# Patient Record
Sex: Female | Born: 1945 | Race: White | Hispanic: No | Marital: Married | Smoking: Never smoker
Health system: Southern US, Community
[De-identification: ages and names within clinical notes are randomized; demographics above are authoritative.]

## PROBLEM LIST (undated history)

## (undated) DIAGNOSIS — K7460 Unspecified cirrhosis of liver: Secondary | ICD-10-CM

## (undated) DIAGNOSIS — D696 Thrombocytopenia, unspecified: Secondary | ICD-10-CM

## (undated) DIAGNOSIS — D649 Anemia, unspecified: Secondary | ICD-10-CM

## (undated) DIAGNOSIS — M8580 Other specified disorders of bone density and structure, unspecified site: Secondary | ICD-10-CM

## (undated) DIAGNOSIS — E119 Type 2 diabetes mellitus without complications: Secondary | ICD-10-CM

## (undated) DIAGNOSIS — C801 Malignant (primary) neoplasm, unspecified: Secondary | ICD-10-CM

## (undated) DIAGNOSIS — K219 Gastro-esophageal reflux disease without esophagitis: Secondary | ICD-10-CM

## (undated) DIAGNOSIS — N3010 Interstitial cystitis (chronic) without hematuria: Secondary | ICD-10-CM

## (undated) DIAGNOSIS — I10 Essential (primary) hypertension: Secondary | ICD-10-CM

## (undated) DIAGNOSIS — E785 Hyperlipidemia, unspecified: Secondary | ICD-10-CM

## (undated) HISTORY — PX: COLONOSCOPY: SHX174

## (undated) HISTORY — DX: Essential (primary) hypertension: I10

## (undated) HISTORY — PX: BRAIN MENINGIOMA EXCISION: SHX576

## (undated) HISTORY — DX: Gastro-esophageal reflux disease without esophagitis: K21.9

## (undated) HISTORY — DX: Other specified disorders of bone density and structure, unspecified site: M85.80

## (undated) HISTORY — DX: Malignant (primary) neoplasm, unspecified: C80.1

## (undated) HISTORY — DX: Type 2 diabetes mellitus without complications: E11.9

## (undated) HISTORY — PX: POLYPECTOMY: SHX149

## (undated) HISTORY — DX: Interstitial cystitis (chronic) without hematuria: N30.10

## (undated) HISTORY — DX: Hyperlipidemia, unspecified: E78.5

## (undated) HISTORY — DX: Unspecified cirrhosis of liver: K74.60

## (undated) HISTORY — DX: Thrombocytopenia, unspecified: D69.6

## (undated) HISTORY — PX: PARTIAL HYSTERECTOMY: SHX80

## (undated) HISTORY — DX: Anemia, unspecified: D64.9

## (undated) HISTORY — PX: LAMINECTOMY: SHX219

---

## 2012-04-27 DIAGNOSIS — IMO0002 Reserved for concepts with insufficient information to code with codable children: Secondary | ICD-10-CM | POA: Insufficient documentation

## 2012-04-27 DIAGNOSIS — R3915 Urgency of urination: Secondary | ICD-10-CM | POA: Insufficient documentation

## 2012-04-27 DIAGNOSIS — R350 Frequency of micturition: Secondary | ICD-10-CM | POA: Insufficient documentation

## 2012-04-27 DIAGNOSIS — R109 Unspecified abdominal pain: Secondary | ICD-10-CM | POA: Insufficient documentation

## 2013-05-24 DIAGNOSIS — R351 Nocturia: Secondary | ICD-10-CM | POA: Insufficient documentation

## 2014-03-05 ENCOUNTER — Encounter: Payer: Self-pay | Admitting: Oncology

## 2014-03-05 ENCOUNTER — Ambulatory Visit (INDEPENDENT_AMBULATORY_CARE_PROVIDER_SITE_OTHER): Payer: Medicare Other | Admitting: Oncology

## 2014-03-05 VITALS — BP 149/56 | HR 85 | Temp 98.2°F | Ht 66.0 in | Wt 181.0 lb

## 2014-03-05 DIAGNOSIS — N3010 Interstitial cystitis (chronic) without hematuria: Secondary | ICD-10-CM

## 2014-03-05 DIAGNOSIS — D696 Thrombocytopenia, unspecified: Secondary | ICD-10-CM | POA: Insufficient documentation

## 2014-03-05 DIAGNOSIS — D61818 Other pancytopenia: Secondary | ICD-10-CM

## 2014-03-05 HISTORY — DX: Interstitial cystitis (chronic) without hematuria: N30.10

## 2014-03-05 HISTORY — DX: Thrombocytopenia, unspecified: D69.6

## 2014-03-05 LAB — CBC WITH DIFFERENTIAL/PLATELET
BASOS PCT: 0 % (ref 0–1)
Basophils Absolute: 0 10*3/uL (ref 0.0–0.1)
Eosinophils Absolute: 0.1 10*3/uL (ref 0.0–0.7)
Eosinophils Relative: 2 % (ref 0–5)
HCT: 35.8 % — ABNORMAL LOW (ref 36.0–46.0)
Hemoglobin: 11.3 g/dL — ABNORMAL LOW (ref 12.0–15.0)
LYMPHS ABS: 0.7 10*3/uL (ref 0.7–4.0)
Lymphocytes Relative: 23 % (ref 12–46)
MCH: 27.7 pg (ref 26.0–34.0)
MCHC: 31.6 g/dL (ref 30.0–36.0)
MCV: 87.7 fL (ref 78.0–100.0)
MONO ABS: 0.3 10*3/uL (ref 0.1–1.0)
Monocytes Relative: 9 % (ref 3–12)
NEUTROS PCT: 66 % (ref 43–77)
Neutro Abs: 2.1 10*3/uL (ref 1.7–7.7)
PLATELETS: 65 10*3/uL — AB (ref 150–400)
RBC: 4.08 MIL/uL (ref 3.87–5.11)
RDW: 14.1 % (ref 11.5–15.5)
WBC: 3.2 10*3/uL — ABNORMAL LOW (ref 4.0–10.5)

## 2014-03-05 LAB — COMPREHENSIVE METABOLIC PANEL
ALT: 24 U/L (ref 0–35)
ANION GAP: 5 (ref 5–15)
AST: 32 U/L (ref 0–37)
Albumin: 4.4 g/dL (ref 3.5–5.2)
Alkaline Phosphatase: 85 U/L (ref 39–117)
BILIRUBIN TOTAL: 0.8 mg/dL (ref 0.3–1.2)
BUN: 13 mg/dL (ref 6–23)
CALCIUM: 9.6 mg/dL (ref 8.4–10.5)
CO2: 29 mmol/L (ref 19–32)
Chloride: 105 mmol/L (ref 96–112)
Creatinine, Ser: 0.78 mg/dL (ref 0.50–1.10)
GFR calc Af Amer: >90 mL/min (ref >90)
GFR calc non Af Amer: 84 mL/min — ABNORMAL LOW (ref >90)
GLUCOSE: 124 mg/dL — AB (ref 70–99)
Potassium: 4 mmol/L (ref 3.5–5.1)
SODIUM: 139 mmol/L (ref 135–145)
Total Protein: 6.9 g/dL (ref 6.0–8.3)

## 2014-03-05 LAB — URINE MICROSCOPIC-ADD ON

## 2014-03-05 LAB — URINALYSIS, ROUTINE W REFLEX MICROSCOPIC
BILIRUBIN URINE: NEGATIVE
Glucose, UA: NEGATIVE mg/dL
Ketones, ur: NEGATIVE mg/dL
Nitrite: NEGATIVE
Protein, ur: NEGATIVE mg/dL
SPECIFIC GRAVITY, URINE: 1.011 (ref 1.005–1.030)
UROBILINOGEN UA: 0.2 mg/dL (ref 0.0–1.0)
pH: 5.5 (ref 5.0–8.0)

## 2014-03-05 LAB — SAVE SMEAR

## 2014-03-05 LAB — RETICULOCYTES
RBC.: 4.08 MIL/uL (ref 3.87–5.11)
RETIC COUNT ABSOLUTE: 24.5 10*3/uL (ref 19.0–186.0)
Retic Ct Pct: 0.6 % (ref 0.4–3.1)

## 2014-03-05 LAB — LACTATE DEHYDROGENASE: LDH: 151 U/L (ref 94–250)

## 2014-03-05 NOTE — Patient Instructions (Signed)
Schedule bone marrow aspiration and biopsy with Radiology: order entered in EPIC Return visit with Dr Beryle Beams 2-3 weeks after bone marrow done to review results

## 2014-03-05 NOTE — Progress Notes (Signed)
Patient ID: Yvonne Carrillo, female   DOB: 12-Aug-1945, 69 y.o.   MRN: [id number] New Patient Hematology   Yvonne Carrillo [id number] [birth year] 69 y.o. 03/05/2014  CC: Dr. Alona Bene; Dr. Woody Seller, [facility] [address] [place], [place], [address]   Reason for referral: Chronic thrombocytopenia with recent fall from baseline count   HPI:  69 year old woman with hypertension, type 2 diabetes on oral agent, hyperlipidemia, and chronic interstitial cystitis. She was first noted to have a mild decrease in her platelet count as far back as 1996 when platelet count was recorded as 110,000. Except for a platelet count of 79,000 recorded in 2000, and platelet counts have been consistently above 100,000 until her most recent counts done by her primary care physician on 11/01/2013 when platelet count was 60,000.  She keeps her a detailed records of her laboratory results over the years. It is now apparent that she does not have isolated thrombocytopenia but in fact, mild pancytopenia. 12/11/2005: Hemoglobin 12.5, hematocrit 37.2, white count 6500, platelets 129,000 10/18/2007: Hemoglobin 12.7, hematocrit 43.2, white count 4500, platelets 102,000 01/14/2011: Hemoglobin 12.8, hematocrit 39.4, white count 4000, platelets 82,000 01/04/2012: Hemoglobin 12.5, hematocrit 37.5, white count 6200, platelets 94,000 07/15/2012: Hemoglobin 12.5, matter crit 39.2, white count 4300, platelets 95,000 04/11/2013: Hemoglobin 12, hematocrit 37.2, white count 3300, platelets 80,000 11/01/2013: Hemoglobin 12.7, hematocrit 38.8, white count 3900, platelets 60,000 03/05/2014: Hemoglobin 11.3, hematocrit 35.8, white count 3200, platelets 65,000.                      Differential: 66% neutrophils, when he 3 lymphocytes, 9 monocytes, 2 eosinophils. MCV 87.7  She has no history of hepatitis, yellow jaundice, or mononucleosis. She denies any signs or symptoms of a collagen vascular disorder and specifically  has never been told she has lupus or rheumatoid arthritis. She denies any easy bruising, epistaxis, hematochezia or melena. Occasional microscopic hematuria related to chronic interstitial cystitis. She has been on Elmiron for about 2 years but low platelet count antedated starting this medication. 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 She does not use alcohol. She is a never smoker. There is no family history of any blood disorder and her parents, sister, or 3 daughters.    PMH: Past Medical History  Diagnosis Date  . Thrombocytopenia 03/05/2014  . Chronic interstitial cystitis 03/05/2014  No history of MI, ulcers, tuberculosis, asthma, emphysema, thyroid disease, inflammatory arthritis. See also, history of present illness  Previous surgery: 1993 hysterectomy without oophorectomy for retroverted uterus 1991 resection of a meningioma Laminectomy 1975, repeat 2007 Colonoscopy 2013. Single benign polyp removed. Annual mammograms reported to her as normal.  Allergies: Allergies  Allergen Reactions  . Epinephrine Other (See Comments)    Increase heart rate  . Penicillins Rash    Medications:  Current outpatient prescriptions:  .  levocetirizine (XYZAL) 5 MG tablet, Take 5 mg by mouth every evening., Disp: , Rfl:  .  pentosan polysulfate (ELMIRON) 100 MG capsule, Take 200 mg by mouth daily., Disp: , Rfl:  .  simvastatin (ZOCOR) 40 MG tablet, Take 20 mg by mouth at bedtime., Disp: , Rfl:  .  sitaGLIPtin-metformin (JANUMET) 50-1000 MG per tablet, Take 1 tablet by mouth daily with supper., Disp: , Rfl:  .  traZODone (DESYREL) 50 MG tablet, Take 25 mg by mouth at bedtime., Disp: , Rfl:  .  valsartan (DIOVAN) 160 MG tablet, Take 160 mg by mouth daily., Disp: , Rfl:   Social History: Married and husband accompanies her today. They have 3 daughters aged 43, 51, and 67 are all  healthy. 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  she has never smoked.   she does not drink alcohol   Family History: Mother was a smoker, died of complications of obstructive airway disease at age 62. Father died of a stroke at age 30. One sister died of lung cancer at age 72. She was a smoker.  Review of Systems: See HPI Remaining ROS negative.  Physical Exam: Blood pressure 149/56, pulse 85, temperature 98.2 F (36.8 C), temperature source Oral, height 5\' 6"  (1.676 m), weight 181 lb (82.101 kg), SpO2 100 %. Wt Readings from Last 3 Encounters:  03/05/14 181 lb (82.101 kg)     General appearance: well nourished caucasian woman HENNT: Pharynx no erythema, exudate, mass, or ulcer. No thyromegaly or thyroid nodules Lymph nodes: No cervical, supraclavicular, or axillary lymphadenopathy Breasts:  Lungs: Clear to auscultation, resonant to percussion throughout Heart: Regular rhythm, no murmur, no gallop, no rub, no click, no edema Abdomen: Soft, nontender, normal bowel sounds, no mass, no organomegaly Extremities: No edema, no calf tenderness Musculoskeletal: no joint deformities GU Vascular: Carotid pulses 2+, no bruits,  Neurologic: Alert, oriented, PERRLA, optic discs sharp and vessels normal, no hemorrhage or exudate, cranial nerves grossly normal, motor strength 5 over 5, reflexes 1+ symmetric, upper body coordination normal, gait normal, Skin: No rash or ecchymosis    Lab Results: Lab Results  Component Value Date   WBC 3.2* 03/05/2014   HGB 11.3* 03/05/2014   HCT 35.8* 03/05/2014   MCV 87.7 03/05/2014   PLT 65* 03/05/2014     Chemistry      Component Value Date/Time   NA 139 03/05/2014 1403   K 4.0 03/05/2014 1403   CL 105 03/05/2014 1403   CO2 29 03/05/2014 1403   BUN 13 03/05/2014 1403   CREATININE 0.78 03/05/2014 1403      Component Value Date/Time   CALCIUM 9.6 03/05/2014 1403   ALKPHOS 85 03/05/2014 1403    AST 32 03/05/2014 1403   ALT 24 03/05/2014 1403   BILITOT 0.8 03/05/2014 1403       Review of peripheral blood film:     Impression: Mild pancytopenia for the last 7 years now with trend for progression.  Recommendation: We need a bone marrow aspiration and biopsy to establish the etiology of her pancytopenia. We discussed this in detail today. She would prefer to have the procedure done under sedation therefore I am going to refer her to our interventional radiology team. The most important information that we can obtain from the marrow will be the percentage of blasts and whether or not there are any chromosomal abnormalities which will help to stratify her risk if this is a myelodysplastic syndrome. We will also be looking to see if there are any excess plasma cells or any lymphoid aggregates. I will meet with her and her husband 2-3 weeks after the procedure to review results and any treatment recommendations .      Annia Belt, MD 03/05/2014, 5:18 PM

## 2014-03-07 ENCOUNTER — Telehealth: Payer: Self-pay | Admitting: *Deleted

## 2014-03-07 LAB — URINE CULTURE
COLONY COUNT: NO GROWTH
CULTURE: NO GROWTH

## 2014-03-07 NOTE — Telephone Encounter (Signed)
Called pt -not at home. Talked to pt's husband  - informed him to let her know  "urine culture no growth" per Dr Beryle Beams. Stated he would inform his wife and she can call if has any questions.

## 2014-03-07 NOTE — Telephone Encounter (Signed)
-----   Message from Annia Belt, MD sent at 03/07/2014 10:59 AM EST ----- Call pt urine culture no growth

## 2014-03-09 ENCOUNTER — Other Ambulatory Visit: Payer: Self-pay | Admitting: Radiology

## 2014-03-12 ENCOUNTER — Other Ambulatory Visit: Payer: Self-pay | Admitting: Radiology

## 2014-03-13 ENCOUNTER — Ambulatory Visit (HOSPITAL_COMMUNITY)
Admission: RE | Admit: 2014-03-13 | Discharge: 2014-03-13 | Disposition: A | Discharge status: Home / Self Care | Payer: Medicare Other | Source: Ambulatory Visit | Attending: Oncology | Admitting: Oncology

## 2014-03-13 ENCOUNTER — Encounter (HOSPITAL_COMMUNITY): Payer: Self-pay

## 2014-03-13 DIAGNOSIS — Z79899 Other long term (current) drug therapy: Secondary | ICD-10-CM | POA: Insufficient documentation

## 2014-03-13 DIAGNOSIS — D61818 Other pancytopenia: Secondary | ICD-10-CM | POA: Insufficient documentation

## 2014-03-13 DIAGNOSIS — N3010 Interstitial cystitis (chronic) without hematuria: Secondary | ICD-10-CM | POA: Insufficient documentation

## 2014-03-13 DIAGNOSIS — D696 Thrombocytopenia, unspecified: Secondary | ICD-10-CM | POA: Insufficient documentation

## 2014-03-13 LAB — CBC WITH DIFFERENTIAL/PLATELET
BASOS PCT: 0 % (ref 0–1)
Basophils Absolute: 0 10*3/uL (ref 0.0–0.1)
Eosinophils Absolute: 0.1 10*3/uL (ref 0.0–0.7)
Eosinophils Relative: 2 % (ref 0–5)
HCT: 37.1 % (ref 36.0–46.0)
HEMOGLOBIN: 11.6 g/dL — AB (ref 12.0–15.0)
LYMPHS ABS: 0.8 10*3/uL (ref 0.7–4.0)
Lymphocytes Relative: 23 % (ref 12–46)
MCH: 27.8 pg (ref 26.0–34.0)
MCHC: 31.3 g/dL (ref 30.0–36.0)
MCV: 88.8 fL (ref 78.0–100.0)
Monocytes Absolute: 0.5 10*3/uL (ref 0.1–1.0)
Monocytes Relative: 13 % — ABNORMAL HIGH (ref 3–12)
NEUTROS PCT: 63 % (ref 43–77)
Neutro Abs: 2.3 10*3/uL (ref 1.7–7.7)
Platelets: 58 10*3/uL — ABNORMAL LOW (ref 150–400)
RBC: 4.18 MIL/uL (ref 3.87–5.11)
RDW: 14.1 % (ref 11.5–15.5)
WBC: 3.7 10*3/uL — AB (ref 4.0–10.5)

## 2014-03-13 LAB — APTT: aPTT: 40 seconds — ABNORMAL HIGH (ref 24–37)

## 2014-03-13 LAB — PROTIME-INR
INR: 1.13 (ref 0.00–1.49)
PROTHROMBIN TIME: 14.6 s (ref 11.6–15.2)

## 2014-03-13 LAB — GLUCOSE, CAPILLARY: GLUCOSE-CAPILLARY: 121 mg/dL — AB (ref 70–99)

## 2014-03-13 LAB — BONE MARROW EXAM

## 2014-03-13 MED ORDER — HYDROCODONE-ACETAMINOPHEN 5-325 MG PO TABS
1.0000 | ORAL_TABLET | ORAL | Status: DC | PRN
  Filled 2014-03-13: qty 2

## 2014-03-13 MED ORDER — FENTANYL CITRATE 0.05 MG/ML IJ SOLN
INTRAMUSCULAR | Status: AC
  Filled 2014-03-13: qty 4

## 2014-03-13 MED ORDER — MIDAZOLAM HCL 2 MG/2ML IJ SOLN
INTRAMUSCULAR | Status: AC | PRN
  Administered 2014-03-13: 0.5 mg via INTRAVENOUS
  Administered 2014-03-13: 1 mg via INTRAVENOUS

## 2014-03-13 MED ORDER — FENTANYL CITRATE 0.05 MG/ML IJ SOLN
INTRAMUSCULAR | Status: AC | PRN
  Administered 2014-03-13: 50 ug via INTRAVENOUS
  Administered 2014-03-13: 25 ug via INTRAVENOUS

## 2014-03-13 MED ORDER — MIDAZOLAM HCL 2 MG/2ML IJ SOLN
INTRAMUSCULAR | Status: AC
  Filled 2014-03-13: qty 6

## 2014-03-13 MED ORDER — SODIUM CHLORIDE 0.9 % IV SOLN
INTRAVENOUS | Status: DC
  Administered 2014-03-13: 07:00:00 via INTRAVENOUS

## 2014-03-13 NOTE — H&P (Addendum)
Chief Complaint: "I am her for a bone marrow biopsy."  Referring Physician(s): Granfortuna,James M  History of Present Illness: Yvonne Carrillo is a 69 y.o. female with pancytopenia who has been seen by Dr. Beryle Beams and scheduled today for an image guided bone marrow biopsy. She denies any chest pain, shortness of breath or palpitations. She denies any active signs of bleeding or excessive bruising. She denies any recent fever or chills. The patient denies any history of sleep apnea or chronic oxygen use. She has previously tolerated sedation without complications during a colonoscopy.     Past Medical History  Diagnosis Date  . Thrombocytopenia 03/05/2014  . Chronic interstitial cystitis 03/05/2014    History reviewed. No pertinent past surgical history.  Allergies: Epinephrine and Penicillins  Medications: Prior to Admission medications   Medication Sig Start Date End Date Taking? Authorizing Provider  levocetirizine (XYZAL) 5 MG tablet Take 5 mg by mouth every evening.   Yes Historical Provider, MD  pentosan polysulfate (ELMIRON) 100 MG capsule Take 100-200 mg by mouth 2 (two) times daily.    Yes Historical Provider, MD  simvastatin (ZOCOR) 40 MG tablet Take 20 mg by mouth at bedtime.   Yes Historical Provider, MD  sitaGLIPtin-metformin (JANUMET) 50-1000 MG per tablet Take 1 tablet by mouth daily with supper.   Yes Historical Provider, MD  traZODone (DESYREL) 50 MG tablet Take 25 mg by mouth at bedtime.   Yes Historical Provider, MD  valsartan (DIOVAN) 160 MG tablet Take 160 mg by mouth every morning.    Yes Historical Provider, MD     History reviewed. No pertinent family history.  History   Social History  . Marital Status: Married    Spouse Name: N/A  . Number of Children: N/A  . Years of Education: N/A   Social History Main Topics  . Smoking status: Never Smoker   . Smokeless tobacco: Not on file  . Alcohol Use: No  . Drug Use: No  . Sexual Activity: Not on  file   Other Topics Concern  . None   Social History Narrative    Review of Systems: A 12 point ROS discussed and pertinent positives are indicated in the HPI above.  All other systems are negative.  Review of Systems  Vital Signs: BP 159/64 mmHg  Pulse 92  Temp(Src) 97.6 F (36.4 C) (Oral)  Ht _0  (1.676 m)  Wt 181 lb (82.101 kg)  BMI 29.23 kg/m2  SpO2 100%  Physical Exam  Constitutional: She is oriented to person, place, and time. No distress.  HENT:  Head: Normocephalic and atraumatic.  Neck: No tracheal deviation present.  Cardiovascular: Normal rate and regular rhythm.  Exam reveals no gallop and no friction rub.   No murmur heard. Pulmonary/Chest: Effort normal and breath sounds normal. No respiratory distress. She has no wheezes. She has no rales.  Abdominal: Soft. Bowel sounds are normal. She exhibits no distension. There is no tenderness.  Neurological: She is alert and oriented to person, place, and time.  Skin: She is not diaphoretic.  Psychiatric: She has a normal mood and affect. Her behavior is normal. Thought content normal.    Mallampati Score:  MD Evaluation Airway: WNL Heart: WNL Abdomen: WNL Chest/ Lungs: WNL ASA  Classification: 2 Mallampati/Airway Score: Two  Imaging: No results found.  Labs:  CBC:  Recent Labs  03/05/14 1403 03/13/14 0720  WBC 3.2* 3.7*  HGB 11.3* 11.6*  HCT 35.8* 37.1  PLT 65* 58*  COAGS:  Recent Labs  03/13/14 0720  INR 1.13  APTT 40*    BMP:  Recent Labs  03/05/14 1403  NA 139  K 4.0  CL 105  CO2 29  GLUCOSE 124*  BUN 13  CALCIUM 9.6  CREATININE 0.78  GFRNONAA 84*  GFRAA >90    LIVER FUNCTION TESTS:  Recent Labs  03/05/14 1403  BILITOT 0.8  AST 32  ALT 24  ALKPHOS 85  PROT 6.9  ALBUMIN 4.4    Assessment and Plan: Pancytopenia History of chronic interstitial cystitis  Seen in the office by Dr. Beryle Beams on 03/05/14 Scheduled today for image guided bone marrow biopsy  with moderate sedation Patient has been NPO, labs reviewed Risks and Benefits discussed with the patient. All of the patient's questions were answered, patient is agreeable to proceed. Consent signed and in chart.   Thank you for this interesting consult.  I greatly enjoyed meeting Yvonne Carrillo and look forward to participating in their care.  SignedHedy Jacob 03/13/2014, 8:34 AM   I spent a total of 15 minutes in face to face in clinical consultation, greater than 50% of which was counseling/coordinating care for pancytopenia.

## 2014-03-13 NOTE — Discharge Instructions (Signed)
Leave dressing on for 24 hours.  You may shower after 24 hours.  Please remove the dressing before you shower.    Conscious Sedation, Adult, Care After Refer to this sheet in the next few weeks. These instructions provide you with information on caring for yourself after your procedure. Your health care provider may also give you more specific instructions. Your treatment has been planned according to current medical practices, but problems sometimes occur. Call your health care provider if you have any problems or questions after your procedure. WHAT TO EXPECT AFTER THE PROCEDURE  After your procedure:  You may feel sleepy, clumsy, and have poor balance for several hours.  Vomiting may occur if you eat too soon after the procedure. HOME CARE INSTRUCTIONS  Do not participate in any activities where you could become injured for at least 24 hours. Do not:  Drive.  Swim.  Ride a bicycle.  Operate heavy machinery.  Cook.  Use power tools.  Climb ladders.  Work from a high place.  Do not make important decisions or sign legal documents until you are improved.  If you vomit, drink water, juice, or soup when you can drink without vomiting. Make sure you have little or no nausea before eating solid foods.  Only take over-the-counter or prescription medicines for pain, discomfort, or fever as directed by your health care provider.  Make sure you and your family fully understand everything about the medicines given to you, including what side effects may occur.  You should not drink alcohol, take sleeping pills, or take medicines that cause drowsiness for at least 24 hours.  If you smoke, do not smoke without supervision.  If you are feeling better, you may resume normal activities 24 hours after you were sedated.  Keep all appointments with your health care provider. SEEK MEDICAL CARE IF:  Your skin is pale or bluish in color.  You continue to feel nauseous or vomit.  Your  pain is getting worse and is not helped by medicine.  You have bleeding or swelling.  You are still sleepy or feeling clumsy after 24 hours. SEEK IMMEDIATE MEDICAL CARE IF:  You develop a rash.  You have difficulty breathing.  You develop any type of allergic problem.  You have a fever. MAKE SURE YOU:  Understand these instructions.  Will watch your condition.  Will get help right away if you are not doing well or get worse. Document Released: 11/02/2012 Document Reviewed: 11/02/2012 [facility] Patient Information 2015 [place], [place]. This information is not intended to replace advice given to you by your health care provider. Make sure you discuss any questions you have with your health care provider. Bone Marrow Aspiration, Bone Marrow Biopsy Care After Read the instructions outlined below and refer to this sheet in the next few weeks. These discharge instructions provide you with general information on caring for yourself after you leave the hospital. Your caregiver may also give you specific instructions. While your treatment has been planned according to the most current medical practices available, unavoidable complications occasionally occur. If you have any problems or questions after discharge, call your caregiver. FINDING OUT THE RESULTS OF YOUR TEST Not all test results are available during your visit. If your test results are not back during the visit, make an appointment with your caregiver to find out the results. Do not assume everything is normal if you have not heard from your caregiver or the medical facility. It is important for you to follow up  on all of your test results.  HOME CARE INSTRUCTIONS  You have had sedation and may be sleepy or dizzy. Your thinking may not be as clear as usual. For the next 24 hours:  Only take over-the-counter or prescription medicines for pain, discomfort, and or fever as directed by your caregiver.  Do not drink alcohol.  Do not  smoke.  Do not drive.  Do not make important legal decisions.  Do not operate heavy machinery.  Do not care for small children by yourself.  Keep your dressing clean and dry. You may replace dressing with a bandage after 24 hours.  You may take a bath or shower after 24 hours.  Use an ice pack for 20 minutes every 2 hours while awake for pain as needed. SEEK MEDICAL CARE IF:   There is redness, swelling, or increasing pain at the biopsy site.  There is pus coming from the biopsy site.  There is drainage from a biopsy site lasting longer than one day.  An unexplained oral temperature above 102 F (38.9 C) develops. SEEK IMMEDIATE MEDICAL CARE IF:   You develop a rash.  You have difficulty breathing.  You develop any reaction or side effects to medications given. Document Released: 08/01/2004 Document Revised: 04/06/2011 Document Reviewed: 01/10/2008 [facility] Patient Information 2015 [place], [place]. This information is not intended to replace advice given to you by your health care provider. Make sure you discuss any questions you have with your health care provider.

## 2014-03-13 NOTE — Procedures (Signed)
CT-guided  R iliac bone marrow aspiration and core biopsy No complication No blood loss. See complete dictation in [facility] PACS  

## 2014-03-21 LAB — TISSUE HYBRIDIZATION (BONE MARROW)-NCBH

## 2014-03-21 LAB — CHROMOSOME ANALYSIS, BONE MARROW

## 2014-04-03 ENCOUNTER — Encounter (HOSPITAL_COMMUNITY): Payer: Self-pay

## 2014-04-09 ENCOUNTER — Encounter: Payer: Self-pay | Admitting: Oncology

## 2014-04-09 ENCOUNTER — Ambulatory Visit (INDEPENDENT_AMBULATORY_CARE_PROVIDER_SITE_OTHER): Payer: Medicare Other | Admitting: Oncology

## 2014-04-09 VITALS — BP 158/77 | HR 84 | Temp 98.1°F | Ht 66.0 in | Wt 179.4 lb

## 2014-04-09 DIAGNOSIS — D61818 Other pancytopenia: Secondary | ICD-10-CM | POA: Diagnosis not present

## 2014-04-09 LAB — CBC WITH DIFFERENTIAL/PLATELET
BASOS PCT: 0 % (ref 0–1)
Basophils Absolute: 0 10*3/uL (ref 0.0–0.1)
EOS ABS: 0 10*3/uL (ref 0.0–0.7)
Eosinophils Relative: 1 % (ref 0–5)
HEMATOCRIT: 35.6 % — AB (ref 36.0–46.0)
Hemoglobin: 11.7 g/dL — ABNORMAL LOW (ref 12.0–15.0)
LYMPHS ABS: 0.9 10*3/uL (ref 0.7–4.0)
Lymphocytes Relative: 21 % (ref 12–46)
MCH: 27.3 pg (ref 26.0–34.0)
MCHC: 32.9 g/dL (ref 30.0–36.0)
MCV: 83 fL (ref 78.0–100.0)
MONOS PCT: 9 % (ref 3–12)
MPV: 9.9 fL (ref 8.6–12.4)
Monocytes Absolute: 0.4 10*3/uL (ref 0.1–1.0)
NEUTROS ABS: 3 10*3/uL (ref 1.7–7.7)
NEUTROS PCT: 69 % (ref 43–77)
Platelets: 63 10*3/uL — ABNORMAL LOW (ref 150–400)
RBC: 4.29 MIL/uL (ref 3.87–5.11)
RDW: 14.3 % (ref 11.5–15.5)
WBC: 4.4 10*3/uL (ref 4.0–10.5)

## 2014-04-09 NOTE — Progress Notes (Signed)
Patient ID: Yvonne Carrillo, female   DOB: 1945-05-11, 69 y.o.   MRN: [id number] The patient returns with her husband today to discuss recent bone marrow biopsy results and for further discussion of her chronic pancytopenia with recent trend for progressive thrombocytopenia.  I have reviewed the bone marrow findings with our pathologist. The bone marrow is normal with no evidence for a myelodysplastic syndrome, leukemia, lymphoma, or multiple myeloma. Normal cytogenetic studies. By process of elimination we have excluded a primary bone marrow disorder as etiology of her pancytopenia. She does not appear to have any signs or symptoms to suggest that this is an immune process although that remains in the differential. I feel we need to concentrate on possible medication toxicity at this time. She was using trazodone to help her sleep. She stopped this now a week ago. In reviewing her other medications, she has been on elmeron to treat chronic interstitial cystitis for over 2 years. Looking this up, although rare, less than 1% of patients, it can cause anemia, leukopenia, and thrombocytopenia. I suggest that she stop the elmeron at this time. If it is a medication effect, it usually takes about 4-6 weeks for the bone marrow to recover. I will see her back in early May to reevaluate her blood counts. I am also going to check for 2 common viruses EBV and CMV which can be associated with chronic bone marrow suppression. She has no history of hepatitis, yellow jaundice, or mononucleosis. She has never required a blood transfusion. Her husband is her only partner. She has never used any intravenous drugs. If antibody titers to either of these viruses are significantly elevated it will only prove that she has been exposed to them and it would not prove that this is the reason why her blood counts are low. We would have to infer this: "Guilt by association".  Recent complete exam not repeated today.  Entire visit spent  with direct face-to-face contact with the patient and her husband approximate time 30 minutes.

## 2014-04-09 NOTE — Patient Instructions (Signed)
Follow up visit with Dr Darnell Level  4/12 3:30 PM CBC same day To lab today  Schedule ultrasound of abdomen 1st available at [facility]

## 2014-04-10 LAB — EBV AB TO VIRAL CAPSID AG PNL, IGG+IGM
EBV VCA IgG: 187 U/mL — ABNORMAL HIGH (ref <18.0)
EBV VCA IgM: 10.9 U/mL (ref <36.0)

## 2014-04-11 LAB — CYTOMEGALOVIRUS ANTIBODY, IGG: CYTOMEGALOVIRUS AB-IGG: 4.4 U/mL — AB (ref <0.60)

## 2014-04-11 LAB — CMV IGM: CMV IgM: <8 AU/mL (ref <30.00)

## 2014-04-26 ENCOUNTER — Other Ambulatory Visit: Payer: Self-pay | Admitting: Oncology

## 2014-04-26 DIAGNOSIS — D61818 Other pancytopenia: Secondary | ICD-10-CM

## 2014-04-26 DIAGNOSIS — R161 Splenomegaly, not elsewhere classified: Secondary | ICD-10-CM

## 2014-05-02 ENCOUNTER — Other Ambulatory Visit (INDEPENDENT_AMBULATORY_CARE_PROVIDER_SITE_OTHER): Payer: Medicare Other

## 2014-05-02 ENCOUNTER — Telehealth: Payer: Self-pay | Admitting: *Deleted

## 2014-05-02 ENCOUNTER — Other Ambulatory Visit: Payer: Self-pay | Admitting: Oncology

## 2014-05-02 ENCOUNTER — Ambulatory Visit
Admission: RE | Admit: 2014-05-02 | Discharge: 2014-05-02 | Disposition: A | Discharge status: Home / Self Care | Payer: Medicare Other | Source: Ambulatory Visit | Attending: Oncology | Admitting: Oncology

## 2014-05-02 DIAGNOSIS — D696 Thrombocytopenia, unspecified: Secondary | ICD-10-CM

## 2014-05-02 DIAGNOSIS — R161 Splenomegaly, not elsewhere classified: Secondary | ICD-10-CM

## 2014-05-02 DIAGNOSIS — D61818 Other pancytopenia: Secondary | ICD-10-CM

## 2014-05-02 LAB — BASIC METABOLIC PANEL
Anion gap: 7 (ref 5–15)
BUN: 10 mg/dL (ref 6–23)
CALCIUM: 9.3 mg/dL (ref 8.4–10.5)
CO2: 30 mmol/L (ref 19–32)
CREATININE: 0.7 mg/dL (ref 0.50–1.10)
Chloride: 103 mmol/L (ref 96–112)
GFR calc non Af Amer: 87 mL/min — ABNORMAL LOW (ref >90)
GLUCOSE: 169 mg/dL — AB (ref 70–99)
POTASSIUM: 4.2 mmol/L (ref 3.5–5.1)
SODIUM: 140 mmol/L (ref 135–145)

## 2014-05-02 NOTE — Telephone Encounter (Signed)
Basic Metabolic Panel ordered prior to CT scan with contrast per protocol to screen for renal dysfunction.  Per patient she can present to clinic this afternoon to have it completed.

## 2014-05-02 NOTE — Addendum Note (Signed)
Addended by: Truddie Crumble on: 05/02/2014 03:26 PM   Modules accepted: Orders

## 2014-05-02 NOTE — Telephone Encounter (Signed)
Returned call to Wells Guiles, [facility] - pt is scheduled for CT scan tomorrow at [facility] and needs a current BUN/CREA. Last one was done 2/8. I called pt who had just left their office about 20 mins ago after picking up the contrast for the procedure but will be able to come this afternoon to our lab. Pt lives in [place], [place]. Need order - since Dr Beryle Beams is on vacation. Thanks

## 2014-05-02 NOTE — Progress Notes (Signed)
A copy of the lab results (BMP) were faxed to [phone],  [facility], [address] 05-02-2014 1632p by Maryan Rued, PBT.

## 2014-05-02 NOTE — Addendum Note (Signed)
Addended by: Truddie Crumble on: 05/02/2014 03:24 PM   Modules accepted: Orders

## 2014-05-03 ENCOUNTER — Ambulatory Visit
Admission: RE | Admit: 2014-05-03 | Discharge: 2014-05-03 | Disposition: A | Discharge status: Home / Self Care | Payer: Medicare Other | Source: Ambulatory Visit | Attending: Oncology | Admitting: Oncology

## 2014-05-03 DIAGNOSIS — D61818 Other pancytopenia: Secondary | ICD-10-CM

## 2014-05-03 DIAGNOSIS — R161 Splenomegaly, not elsewhere classified: Secondary | ICD-10-CM

## 2014-05-03 MED ORDER — IOPAMIDOL (ISOVUE-300) INJECTION 61%
100.0000 mL | Freq: Once | INTRAVENOUS | Status: AC | PRN
  Administered 2014-05-03: 100 mL via INTRAVENOUS

## 2014-05-04 ENCOUNTER — Telehealth: Payer: Self-pay | Admitting: *Deleted

## 2014-05-04 NOTE — Telephone Encounter (Signed)
Pt called to see when she could resume diabetic med after CT scan. CT scan was done 05/03/14 and Dr Ellwood Dense states can resume 48 hours after CT. Pt aware. Hilda Blades Reilynn Lauro RN 05/04/14 3PM

## 2014-05-08 ENCOUNTER — Encounter: Payer: Self-pay | Admitting: Oncology

## 2014-05-08 ENCOUNTER — Ambulatory Visit (INDEPENDENT_AMBULATORY_CARE_PROVIDER_SITE_OTHER): Payer: Medicare Other | Admitting: Oncology

## 2014-05-08 VITALS — BP 151/59 | HR 83 | Temp 98.7°F | Ht 66.0 in | Wt 179.0 lb

## 2014-05-08 DIAGNOSIS — K7469 Other cirrhosis of liver: Secondary | ICD-10-CM

## 2014-05-08 DIAGNOSIS — R161 Splenomegaly, not elsewhere classified: Secondary | ICD-10-CM | POA: Diagnosis not present

## 2014-05-08 DIAGNOSIS — D61818 Other pancytopenia: Secondary | ICD-10-CM | POA: Diagnosis present

## 2014-05-08 DIAGNOSIS — K7460 Unspecified cirrhosis of liver: Secondary | ICD-10-CM | POA: Insufficient documentation

## 2014-05-08 HISTORY — DX: Unspecified cirrhosis of liver: K74.60

## 2014-05-08 NOTE — Progress Notes (Signed)
Patient ID: Yvonne Carrillo, female   DOB: 1945/11/12, 69 y.o.   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 She was referred here in February 2016 in view of pancytopenia with progressive thrombocytopenia. Although I could not palpate any organomegaly on my initial exam, ultrasound of the abdomen done 04/12/2014 demonstrated splenomegaly without any lymphadenopathy. She had no peripheral lymphadenopathy. 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 Chemistry profile was normal except for random glucose 124.  No evidence for a hemolytic process with normal LDH 251, , bilirubin 0.8, and reticulocyte count 0.6%. I checked CMV and EBV viral titers. She had evidence for exposure to both of these viruses with high IgG titers but negative IgM titers. CT scan of the abdomen and a chest radiograph were done on 05/03/2014. CT findings consistent with hepatic cirrhosis. No focal liver masses. Evidence for recanalization of the umbilical veins and mild esophageal varices consistent with portal hypertension, and mild to moderate splenomegaly spleen measuring 15-16 centimeters in length also consistent with portal venous hypertension. No intra-abdominal adenopathy. No focal defects in the spleen.  At this point we have an explanation for her pancytopenia and splenomegaly-hepatic cirrhosis. She has no known exposures. She does not drink alcohol. She has never had a blood transfusion.  I'm going to check hepatitis A, B, C, and with her permission, HIV. I tried to reassure her that she has normal liver function and a normal liver size and this is likely going to be a stable situation for a long time. If we detect chronic hepatitis C  infection, we will get RNA quantitative levels and a genotype. If indicated, we now have excellent treatment that can eradicate this virus within a few months. If her hepatitis profile is negative, she should be evaluated by a liver specialist with consideration for biopsy and other special tests to rule out autoimmune disease. I will call her with results of the hepatitis profile. I gave her copies of all of her records generated here.  Approximately 35 minutes spent with direct face-to-face consultation with this patient and her husband to review the above data including reviewing the CT images and discussion about cirrhosis.

## 2014-05-08 NOTE — Patient Instructions (Signed)
To lab today  Return visit 1st available in May to discuss lab results

## 2014-05-09 LAB — CBC WITH DIFFERENTIAL/PLATELET
BASOS PCT: 0 % (ref 0–1)
Basophils Absolute: 0 10*3/uL (ref 0.0–0.1)
Eosinophils Absolute: 0 10*3/uL (ref 0.0–0.7)
Eosinophils Relative: 1 % (ref 0–5)
HEMATOCRIT: 35.4 % — AB (ref 36.0–46.0)
HEMOGLOBIN: 11.5 g/dL — AB (ref 12.0–15.0)
Lymphocytes Relative: 26 % (ref 12–46)
Lymphs Abs: 0.8 10*3/uL (ref 0.7–4.0)
MCH: 26.9 pg (ref 26.0–34.0)
MCHC: 32.5 g/dL (ref 30.0–36.0)
MCV: 82.9 fL (ref 78.0–100.0)
MONOS PCT: 11 % (ref 3–12)
MPV: 11 fL (ref 8.6–12.4)
Monocytes Absolute: 0.3 10*3/uL (ref 0.1–1.0)
NEUTROS ABS: 1.9 10*3/uL (ref 1.7–7.7)
Neutrophils Relative %: 62 % (ref 43–77)
Platelets: 34 10*3/uL — ABNORMAL LOW (ref 150–400)
RBC: 4.27 MIL/uL (ref 3.87–5.11)
RDW: 14.3 % (ref 11.5–15.5)
WBC: 3 10*3/uL — ABNORMAL LOW (ref 4.0–10.5)

## 2014-05-09 LAB — HEPATITIS PANEL, ACUTE
HCV AB: NEGATIVE
HEP A IGM: NONREACTIVE
HEP B C IGM: NONREACTIVE
Hepatitis B Surface Ag: NEGATIVE

## 2014-05-09 LAB — HIV ANTIBODY (ROUTINE TESTING W REFLEX): HIV: NONREACTIVE

## 2014-05-10 ENCOUNTER — Other Ambulatory Visit: Payer: Self-pay | Admitting: Oncology

## 2014-05-10 DIAGNOSIS — D61818 Other pancytopenia: Secondary | ICD-10-CM

## 2014-05-10 DIAGNOSIS — K7469 Other cirrhosis of liver: Secondary | ICD-10-CM

## 2014-05-18 ENCOUNTER — Telehealth: Payer: Self-pay | Admitting: *Deleted

## 2014-05-18 NOTE — Telephone Encounter (Signed)
Pt called - Appt given for Monday 4/25 @ 1100AM.

## 2014-05-18 NOTE — Telephone Encounter (Signed)
Did she ever come back here for the additional lab tests I ordered?

## 2014-05-18 NOTE — Telephone Encounter (Signed)
Pt called / informed her GI appt is May 2 @ 1045AM; with Dr Benson Norway (after looking at her records, they decided Dr Benson Norway would be the one to see her) at [facility]. She stated u would call and talk to the GI doctor prior to appt. [place] telephone # [phone]. Thanks

## 2014-05-18 NOTE — Telephone Encounter (Signed)
I specifically told her we needed to get additional labs prior to visit with GI to expedite their evaluation and put the orders in EPIC with message for front office to call her in - see if she can come in this Monday 4/25.

## 2014-05-18 NOTE — Telephone Encounter (Signed)
Per Epic, she has not been back since 4/12.16.  She did not mention any additional labs or an appt for labs.

## 2014-05-21 ENCOUNTER — Other Ambulatory Visit (INDEPENDENT_AMBULATORY_CARE_PROVIDER_SITE_OTHER): Payer: Medicare Other

## 2014-05-21 DIAGNOSIS — D61818 Other pancytopenia: Secondary | ICD-10-CM

## 2014-05-21 DIAGNOSIS — K7469 Other cirrhosis of liver: Secondary | ICD-10-CM

## 2014-05-21 NOTE — Addendum Note (Signed)
Addended by: Truddie Crumble on: 05/21/2014 11:45 AM   Modules accepted: Orders

## 2014-05-22 LAB — ANA: Anti Nuclear Antibody(ANA): NEGATIVE

## 2014-05-22 LAB — FERRITIN: Ferritin: 33 ng/mL (ref 10–291)

## 2014-05-23 LAB — ALPHA-1-ANTITRYPSIN: A-1 Antitrypsin, Ser: 121 mg/dL (ref 83–199)

## 2014-05-23 LAB — CERULOPLASMIN: CERULOPLASMIN: 28 mg/dL (ref 18–53)

## 2014-05-23 LAB — MITOCHONDRIAL/SMOOTH MUSCLE AB PNL
MITOCHONDRIAL M2 AB, IGG: 0.63 (ref <0.91)
Smooth Muscle Ab: 9 U (ref <20)

## 2014-05-24 ENCOUNTER — Telehealth: Payer: Self-pay | Admitting: *Deleted

## 2014-05-24 NOTE — Telephone Encounter (Signed)
-----   Message from Annia Belt, MD sent at 05/24/2014 10:23 AM EDT ----- Call pt: all special tests on liver to look for immune conditions or hereditary conditions are normal.  We will forward results to GI doctor.  OK to send copy to pt as well.

## 2014-05-24 NOTE — Telephone Encounter (Signed)
Pt called / informed all special tests on her liver for "immune conditions or hereditary conditions" are normal per Dr Beryle Beams. Copy of labs were faxed to Dr [facility] office and mailed to pt.

## 2014-05-29 ENCOUNTER — Other Ambulatory Visit: Payer: Self-pay | Admitting: Oncology

## 2014-05-29 MED ORDER — PREDNISONE 20 MG PO TABS: 60.0000 mg | ORAL_TABLET | Freq: Every day | ORAL | Status: DC

## 2014-05-30 ENCOUNTER — Encounter: Payer: Self-pay | Admitting: Oncology

## 2014-05-30 ENCOUNTER — Other Ambulatory Visit: Payer: Self-pay | Admitting: Oncology

## 2014-05-30 DIAGNOSIS — D696 Thrombocytopenia, unspecified: Secondary | ICD-10-CM

## 2014-05-30 DIAGNOSIS — D61818 Other pancytopenia: Secondary | ICD-10-CM

## 2014-05-30 NOTE — Progress Notes (Unsigned)
Patient ID: Yvonne Carrillo, female   DOB: 05-Dec-1945, 69 y.o.   MRN: [id number] The patient asked me to call her after she was evaluated by gastroenterology. 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 She was referred to me for evaluation of pancytopenia. Bone marrow biopsy was unrevealing. Subsequent evaluation revealed early cirrhotic changes in the liver and associated splenomegaly. Hepatitis A, B, C, HIV negative. Liver functions normal. I sent off an autoimmune profile anti-smooth muscle and antimitochondrial antibodies which were negative. ANA negative. Serum ferritin normal. Ceruloplasmin normal. Alpha-1 antitrypsin level normal. Gastroenterologist told her aggregate of findings above were consistent with nonalcoholic steatohepatitis (NASH) and told her it might help to lose weight. He repeated lab and told the patient her platelet count came back at 22,000. There has been a steady fall in her platelet count since her first visit with me in February of this year when platelet counts were 65,000 with subsequent fall to 34,000 on 05/08/2014. At this point, despite the negative studies summarized above, I believe we aren't dealing with an autoimmune process. This degree of cytopenia is totally disproportionate to her degree of liver disease. I'm advising a trial of prednisone. She weighs 80 kg. She is diabetic. Optimally I would start at 1 mg/kg but in view of her diabetes I'm going to start at 60 mg of prednisone daily. I will give her primary care physician a call to see if blood counts can be checked in [place] where she lives and forwarded to me so that further adjustments in her medications can be made and to assess any response to steroids.

## 2014-05-31 ENCOUNTER — Telehealth: Payer: Self-pay | Admitting: *Deleted

## 2014-05-31 NOTE — Telephone Encounter (Signed)
Rx for CBC was faxed to Dr D. Hungarland in [place] [phone]) pe Dr Beryle Beams.

## 2014-06-07 DIAGNOSIS — D731 Hypersplenism: Secondary | ICD-10-CM | POA: Insufficient documentation

## 2014-06-14 ENCOUNTER — Telehealth: Payer: Self-pay | Admitting: *Deleted

## 2014-06-14 NOTE — Telephone Encounter (Signed)
Per Dr. Azucena Freed request, pt called and notified " her white count also back to normal on the steroids." When Dr. Beryle Beams called her earlier, he noted he had only seen the platelet result. Pt acknowledged this info re: her WBC count and stated she had just had her lab drawn in [place] yesterday so would wait to hear how this week's lab results are also.  Yvonna Alanis, RN, 5/191/6, 1:06 PM   Pt informed that WBC count now 7/7. Pt replied "that is wonderful" and thanked this RN for calling back with exact count from 06/06/14 lab draw. Yvonna Alanis, RN, 06/14/14,1:14 PM

## 2014-06-14 NOTE — Telephone Encounter (Signed)
CBC/ Diff (collected on 5/18)result called to pt per Dr Beryle Beams and instructed to stay on steroids/ same dosage. No questions.

## 2014-06-21 ENCOUNTER — Telehealth: Payer: Self-pay | Admitting: *Deleted

## 2014-06-21 NOTE — Telephone Encounter (Signed)
Pt called / informed of lab results from [facility])  Per Dr Beryle Beams - Platelets 73,000 and WBC 7,700. Also instructed to continue 60 mg of Prednisone;hope to decrease dose to 40 mg next week depending on her counts - pt voiced understanding.

## 2014-06-29 ENCOUNTER — Telehealth: Payer: Self-pay | Admitting: *Deleted

## 2014-06-29 NOTE — Telephone Encounter (Addendum)
Returned pt's call - wanted to know if we received lab results this week; I told her I have not seen them for this week from [place]. Stated she had called their office this morning (opened 1/2 day only). Also concerned about elevated blood sugars. Stated they are high after 5PM; close to 400 then down the next monring between 170 - 180's. Taking Prednisone. Her MD in [place] has increased Metformin to 3 tabs daily. I told her I will let u know also when we receive her labs. Pt informed u are out of the office today; be back Monday.

## 2014-07-02 NOTE — Telephone Encounter (Signed)
-----   Message from Annia Belt, MD sent at 07/01/2014 10:26 AM EDT ----- Call pt w results  Have her decrease prednisone to 40 mg daily  Continue weekly counts for now ----- Message -----    From: Ebbie Latus, RN    Sent: 06/29/2014   3:40 PM      To: Annia Belt, MD  Hi Dr Darnell Level, Received fax from [place]- pt's platelet count is 64; WBC 5.8. Copy in your mailbox in the clinic. Jani Ploeger

## 2014-07-02 NOTE — Telephone Encounter (Signed)
I called pt - informed her to decreased Prednisone to 40 mg daily and to continue weekly counts for now per Dr Beryle Beams. Informed her platelets are 64, hgb 12.0. She wanted to know how to take it - told her, she can take it one with breakfast and one with lunch and see how her blood sugars do.  She also said she will be out of town the week of the 13th so she will miss getting her labs done on the 15th; wants to know will this be ok. Also she has an appt scheduled with you the 13th.

## 2014-07-09 ENCOUNTER — Encounter: Payer: Medicare Other | Admitting: Oncology

## 2014-07-16 ENCOUNTER — Telehealth: Payer: Self-pay | Admitting: *Deleted

## 2014-07-16 NOTE — Telephone Encounter (Signed)
Pt called back and stated last blood draw was June 8th and she's taking 40 mg of Prednisone.

## 2014-07-16 NOTE — Telephone Encounter (Signed)
I do not have a report on my desk - if it did not show up in my box in clinic, will have to call the lab who did the test & have them send another report DrG

## 2014-07-16 NOTE — Telephone Encounter (Signed)
Message per pt - requesting last test results. Called pt back - no answer; to find out when she had last drawn last; she had been out of town. I have not seen any results lately.

## 2014-07-17 ENCOUNTER — Telehealth: Payer: Self-pay | Admitting: *Deleted

## 2014-07-17 NOTE — Telephone Encounter (Signed)
Pt called / informed of her counts from 07/04/14 ; informed to decreased Prednisone to 20 mg daily and check counts in 2 weeks per [place]. Pt states when labs drawn on the 8th, she was taking 60 mg of Prednisone; received the call to decreased Prednisone to 40 mg on June 6th - had not started decreasing med yet. Told her to go ahead and get blood work done tomorrow as originally scheduled (on 40 mg of Prednisone) before decreasing to 20 mg - stated she feels better about this - and I will let Dr Beryle Beams know.

## 2014-07-17 NOTE — Telephone Encounter (Signed)
-----   Message from Annia Belt, MD sent at 07/17/2014 10:50 AM EDT ----- Thanks.  Please call her - have her decrease prednisone to 20 mg daily. Check count in 2 weeks DrG ----- Message -----    From: Ebbie Latus, RN    Sent: 07/17/2014   9:38 AM      To: Annia Belt, MD  Hi Dr G Lab results from Dr Waldron Session office in [place] - Platelets 62, Hgb 11.9, WBC 5.8 ; collected date 07/04/14.  Copy in your mailbox. Thanks GP

## 2014-07-18 NOTE — Telephone Encounter (Signed)
OK with Dr Beryle Beams.

## 2014-07-20 ENCOUNTER — Telehealth: Payer: Self-pay | Admitting: *Deleted

## 2014-07-20 NOTE — Telephone Encounter (Signed)
Talked with Dr Beryle Beams  -  aware of platelet ct 50 or 60 - hard to read on Epic. Suggest for pt to repeat lab in 2 weeks and to go to Prednisone 20mg  per Dr Beryle Beams. Pt aware.

## 2014-07-20 NOTE — Telephone Encounter (Signed)
Pt called to check on labs  from 07/18/14. Pt states she is on Prednisone 40mg  daily - this is the only labwork done on this dosage of 40mg . Labs were not done last week - out of town. Pt states she has been on Prednisone  40mg  since 07/02/14.   Hilda Blades Claudine Stallings RN  07/20/14 2PM

## 2014-08-06 ENCOUNTER — Telehealth: Payer: Self-pay | Admitting: Oncology

## 2014-08-06 NOTE — Telephone Encounter (Signed)
Patient checking to see if her recent lab draw on 08/01/2014 was rec'd from Dr. Candelaria Celeste  [phone] in [place].  Patient would like for you to give her call back.

## 2014-08-07 ENCOUNTER — Other Ambulatory Visit: Payer: Self-pay | Admitting: Oncology

## 2014-08-07 DIAGNOSIS — D696 Thrombocytopenia, unspecified: Secondary | ICD-10-CM

## 2014-08-07 NOTE — Telephone Encounter (Signed)
Received lab results from [facility] - reviewed by Dr Beryle Beams. Pt called / informed Platelets 78,000; WBC normal @ 8.0; and to decrease Prednisone to 10 mg daily and repeat lab in 2 weeks. Pt voiced understanding. Also asked about Hgb/Hct - which is 12.0/36.3.

## 2014-08-10 ENCOUNTER — Telehealth: Payer: Self-pay | Admitting: *Deleted

## 2014-08-10 NOTE — Telephone Encounter (Signed)
Pt called and stated Dr Waldron Session office in [place] needs another order to continue drawing  her labs at their office.  The original rx has expired. Thanks

## 2014-08-10 NOTE — Telephone Encounter (Signed)
For here or [place]?  She has visit here in August - I believe I already put in labs for that visit. DrG

## 2014-08-13 NOTE — Telephone Encounter (Signed)
Rx for labs faxed to Dr [facility] office ([phone]) Pt called - no answer; left message rx has been faxed and to call if she has any questions.

## 2014-08-13 NOTE — Telephone Encounter (Signed)
Pt has labs drawn in [place]; she's on every 2 weeks schedule; next time should be around July 26th. Next appt here is August 23 w/ labs.

## 2014-08-23 ENCOUNTER — Telehealth: Payer: Self-pay | Admitting: *Deleted

## 2014-08-23 NOTE — Telephone Encounter (Signed)
Pt called / informed Platelets are 80,000; decrease Prednisone to 10 mg every other day x 2 weeks then stop per Dr Beryle Beams. Pt repeated instructions/voiced understanding. She asked about Hgb / WBC - which are 10.6/6.8. She has an appt Aug 23.

## 2014-08-28 ENCOUNTER — Telehealth: Payer: Self-pay | Admitting: *Deleted

## 2014-08-28 NOTE — Telephone Encounter (Signed)
Call from pt - wants to know if she should have platelets checked prior to her appt on the 23rd. I had talked to Dr Beryle Beams last week about this - stated pt can have labs done in [place] as ordered.

## 2014-09-12 ENCOUNTER — Telehealth: Payer: Self-pay | Admitting: Oncology

## 2014-09-12 NOTE — Telephone Encounter (Signed)
Pt called and given results per Dr Beryle Beams.

## 2014-09-12 NOTE — Telephone Encounter (Signed)
Patient is calling to see if results are back from lab work.

## 2014-09-18 ENCOUNTER — Encounter: Payer: Self-pay | Admitting: Oncology

## 2014-09-18 ENCOUNTER — Ambulatory Visit (INDEPENDENT_AMBULATORY_CARE_PROVIDER_SITE_OTHER): Payer: Medicare Other | Admitting: Oncology

## 2014-09-18 VITALS — BP 136/68 | HR 77 | Temp 97.5°F | Ht 66.0 in | Wt 171.5 lb

## 2014-09-18 DIAGNOSIS — D696 Thrombocytopenia, unspecified: Secondary | ICD-10-CM

## 2014-09-18 DIAGNOSIS — K7469 Other cirrhosis of liver: Secondary | ICD-10-CM

## 2014-09-18 DIAGNOSIS — D732 Chronic congestive splenomegaly: Secondary | ICD-10-CM

## 2014-09-18 DIAGNOSIS — D61818 Other pancytopenia: Secondary | ICD-10-CM

## 2014-09-18 NOTE — Progress Notes (Signed)
Patient ID: Yvonne Carrillo, female   DOB: Oct 24, 1945, 69 y.o.   MRN: [id number] Hematology and Oncology Follow Up Visit  Yvonne Carrillo [id number] [birth year] 69 y.o. 09/18/2014 11:48 AM   Principle Diagnosis: Encounter Diagnoses  Name Primary?  . Thrombocytopenia Yes  . Other pancytopenia   . 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 Although I could not palpate any organomegaly on my initial exam, ultrasound of the abdomen done 04/12/2014 demonstrated splenomegaly without any lymphadenopathy. She had no peripheral lymphadenopathy. 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 Chemistry profile was normal except for random glucose 124.  No evidence for a hemolytic process with normal LDH 251, , bilirubin 0.8, and reticulocyte count 0.6%. I checked CMV and EBV viral titers. She had evidence for exposure to both of these viruses with high IgG titers but negative IgM titers. CT scan of the abdomen and a chest radiograph were done on 05/03/2014. CT findings consistent with hepatic cirrhosis. No focal liver masses. Evidence for recanalization of the umbilical veins and mild esophageal varices consistent with portal hypertension, and mild to moderate splenomegaly spleen measuring 15-16 centimeters in length also consistent with portal venous hypertension. No intra-abdominal adenopathy. No focal defects in the spleen.  At that point we had an explanation for her pancytopenia and splenomegaly-hepatic cirrhosis. She has no known exposures. She does not drink alcohol. She has never had a blood transfusion.  Hepatitis A, B, C, and  HIV all negative. Ceruloplasmin 28: normal ANA: negative; alpha-1-antitrypsin  121:normal;  anti-smooth muscle and anti-mitochondrial antibodies: not detected  I referred her to a gastroenterologist to consider a liver biopsy. This was not done. Her platelet count and white count began to fall lower than I would expect for nonalcoholic hepatitis down to 34,000. I suspected that she had an immune component to her leukopenia. I started her on a course of oral prednisone. She had a prompt improvement in her counts with peak platelet count up to 80,000. Steroids have now been tapered and she has been off steroids since the second week of August 2016.   Interim History:   In view of her diabetes, sugars got out of control when she was on the steroids she otherwise tolerated them well.. Her only other complaint is that her hair is getting pedal and breaking as well as getting thin. No interim infections.  Medications: reviewed  Allergies:  Allergies  Allergen Reactions  . Epinephrine Other (See Comments)    Increase heart rate  . Penicillins Rash    Review of Systems: See HPI Remaining ROS negative:   Physical Exam: Blood pressure 136/68, pulse 77, temperature 97.5 F (36.4 C), temperature source Oral, height 5\' 6"  (1.676 m), weight 171 lb 8 oz (77.792 kg), SpO2 100 %. Wt Readings from Last 3 Encounters:  09/18/14 171 lb 8 oz (77.792 kg)  05/08/14 179 lb (81.194 kg)  04/09/14 179 lb 6.4 oz (81.375 kg)     General appearance: well nourished caucasian woman HENNT: Pharynx no erythema, exudate, mass, or ulcer. No thyromegaly or thyroid nodules Lymph nodes: No cervical, supraclavicular, or axillary lymphadenopathy Breasts:  Lungs: Clear to auscultation, resonant to percussion throughout Heart: Regular rhythm, no murmur, no gallop, no rub, no click, no edema Abdomen: Soft,  nontender, normal bowel sounds, no mass, no organomegaly Extremities: No edema, no calf tenderness Musculoskeletal: no joint deformities GU:  Vascular: Carotid pulses 2+, no bruits,   Neurologic: Alert, oriented, PERRLA,  cranial nerves grossly normal, motor strength 5 over 5, reflexes 1+ symmetric, upper body coordination normal, gait normal, Skin: No rash or ecchymosis  Lab Results: CBC W/Diff    Component Value Date/Time   WBC 3.0* 05/08/2014 1644   RBC 4.27 05/08/2014 1644   RBC 4.08 03/05/2014 1403   HGB 11.5* 05/08/2014 1644   HCT 35.4* 05/08/2014 1644   PLT 34* 05/08/2014 1644   MCV 82.9 05/08/2014 1644   MCH 26.9 05/08/2014 1644   MCHC 32.5 05/08/2014 1644   RDW 14.3 05/08/2014 1644   LYMPHSABS 0.8 05/08/2014 1644   MONOABS 0.3 05/08/2014 1644   EOSABS 0.0 05/08/2014 1644   BASOSABS 0.0 05/08/2014 1644     Chemistry      Component Value Date/Time   NA 140 05/02/2014 1507   K 4.2 05/02/2014 1507   CL 103 05/02/2014 1507   CO2 30 05/02/2014 1507   BUN 10 05/02/2014 1507   CREATININE 0.70 05/02/2014 1507      Component Value Date/Time   CALCIUM 9.3 05/02/2014 1507   ALKPHOS 85 03/05/2014 1403   AST 32 03/05/2014 1403   ALT 24 03/05/2014 1403   BILITOT 0.8 03/05/2014 1403    Addendum 09/19/14: Both white count and platelets have fallen: 3,700 & 58,000 respectively     Impression:   #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 Normalization of her white blood count. Platelet count up to 80,000. See addendum above: The patient was called. I give her the option of waiting 2 weeks and repeating lab or going back on steroids now with prednisone 40 mg daily. She had uncontrolled sugars on prednisone. She has an appointment to see endocrinology Dr. Elyse Hsu on August 29. She wants to wait until she sees him to make a decision on the steroids. It appears she will need some chronic immunosuppressive therapy.  I would still like her to see a liver specialist. I do think a baseline liver biopsy may help understand the  pathology better and served as a reference for the future. I gave her the name of a liver specialist at [facility] and at [facility].  #2. Cryptogenic cirrhosis     Annia Belt, MD 8/23/201611:48 AM

## 2014-09-18 NOTE — Patient Instructions (Signed)
To lab today  CBC through primary care MD office in 2 months Follow up visit with Dr Darnell Level in 4 months lab day of visit

## 2014-09-19 ENCOUNTER — Telehealth: Payer: Self-pay | Admitting: *Deleted

## 2014-09-19 LAB — COMPREHENSIVE METABOLIC PANEL
ALT: 16 IU/L (ref 0–32)
AST: 19 IU/L (ref 0–40)
Albumin/Globulin Ratio: 2.1 (ref 1.1–2.5)
Albumin: 4.1 g/dL (ref 3.6–4.8)
Alkaline Phosphatase: 75 IU/L (ref 39–117)
BILIRUBIN TOTAL: 0.7 mg/dL (ref 0.0–1.2)
BUN/Creatinine Ratio: 14 (ref 11–26)
BUN: 9 mg/dL (ref 8–27)
CO2: 22 mmol/L (ref 18–29)
CREATININE: 0.66 mg/dL (ref 0.57–1.00)
Calcium: 9.1 mg/dL (ref 8.7–10.3)
Chloride: 107 mmol/L (ref 97–108)
GFR calc non Af Amer: 91 mL/min/{1.73_m2} (ref >59)
GFR, EST AFRICAN AMERICAN: 105 mL/min/{1.73_m2} (ref >59)
GLUCOSE: 90 mg/dL (ref 65–99)
Globulin, Total: 2 g/dL (ref 1.5–4.5)
Potassium: 4 mmol/L (ref 3.5–5.2)
Sodium: 146 mmol/L — ABNORMAL HIGH (ref 134–144)
TOTAL PROTEIN: 6.1 g/dL (ref 6.0–8.5)

## 2014-09-19 LAB — CBC WITH DIFFERENTIAL/PLATELET
BASOS ABS: 0 10*3/uL (ref 0.0–0.2)
Basos: 0 %
EOS (ABSOLUTE): 0.1 10*3/uL (ref 0.0–0.4)
EOS: 2 %
HEMOGLOBIN: 11.2 g/dL (ref 11.1–15.9)
Hematocrit: 34.7 % (ref 34.0–46.6)
IMMATURE GRANULOCYTES: 0 %
Immature Grans (Abs): 0 10*3/uL (ref 0.0–0.1)
LYMPHS: 22 %
Lymphocytes Absolute: 0.8 10*3/uL (ref 0.7–3.1)
MCH: 29.5 pg (ref 26.6–33.0)
MCHC: 32.3 g/dL (ref 31.5–35.7)
MCV: 91 fL (ref 79–97)
MONOCYTES: 12 %
Monocytes Absolute: 0.4 10*3/uL (ref 0.1–0.9)
Neutrophils Absolute: 2.3 10*3/uL (ref 1.4–7.0)
Neutrophils: 64 %
PLATELETS: 58 10*3/uL — AB (ref 150–379)
RBC: 3.8 x10E6/uL (ref 3.77–5.28)
RDW: 15.2 % (ref 12.3–15.4)
WBC: 3.7 10*3/uL (ref 3.4–10.8)

## 2014-09-19 LAB — GAMMA GT: GGT: 21 IU/L (ref 0–60)

## 2014-09-19 LAB — RETICULOCYTES: RETIC CT PCT: 0.6 % (ref 0.6–2.6)

## 2014-09-19 LAB — LACTATE DEHYDROGENASE: LDH: 167 IU/L (ref 119–226)

## 2014-09-19 NOTE — Telephone Encounter (Signed)
-----   Message from Annia Belt, MD sent at 09/19/2014 11:37 AM EDT ----- Call pt: liver chemistry  remains normal however, platelet count down to 58,000 and white count 3,700. Although these are both safe ranges to be in, I would like her to go back on prednisone 40 mg daily. Check counts again in 2 weeks. Does she have contact number for lab she wants to use so we can send an order for lab? She will not be happy with this news. Alternative is for her not to go on steroids but repeat the lab in 2 weeks then make final decision on the steroids.

## 2014-09-19 NOTE — Telephone Encounter (Signed)
Pt called / informed about her lab results; "liver chem remains normal however platelet count down to 58,000 and white count 3,700. Although these are both safe ranges to be in, I would like her to go back on prednisone 40 mg daily. Check counts again in 2 weeks" per Dr Beryle Beams. Or "Alternative is for her not to go on steroids but repeat the lab in 2 weeks then make final decision on the steroids" per Dr Beryle Beams. Pt stated she will decide and let us know about whether she wants to re-start Prednisone. Stated being off of Prednisone, her blood sugars are very good.And she has an appt to see Dr Altheimer on Monday.

## 2014-09-20 ENCOUNTER — Telehealth: Payer: Self-pay | Admitting: Oncology

## 2014-09-20 NOTE — Telephone Encounter (Signed)
Pt is requesting lab results from august 11th   Wants results faxed from 09/18/14 labwork to [phone] [place] [facility]. appt Monday 8/29

## 2014-09-20 NOTE — Telephone Encounter (Signed)
Will check with Dr. Beryle Beams tomorrow.

## 2014-09-21 NOTE — Telephone Encounter (Signed)
Labs that were printed by [facility] were  faxed to [phone] per Dr Beryle Beams.

## 2014-09-21 NOTE — Telephone Encounter (Signed)
She should have access to her labs on my chart but OK to fax  a copy to number requested

## 2014-09-26 ENCOUNTER — Telehealth: Payer: Self-pay | Admitting: Oncology

## 2014-09-26 NOTE — Telephone Encounter (Signed)
Patient would like orders for her labs to be sent to [facility] in [place]

## 2014-09-26 NOTE — Telephone Encounter (Signed)
Talked with pt - labs are due 10/02/14 and would like to have done at  [facility] in [place]  fax # [phone]. Pt decided to stay off Prednisone till labwork is done 10/02/14. Hilda Blades Lola Czerwonka RN 09/26/14 2PM

## 2014-09-26 NOTE — Telephone Encounter (Signed)
OK will do.

## 2014-09-26 NOTE — Telephone Encounter (Signed)
Pt aware Dr Beryle Beams response.

## 2014-10-03 NOTE — Telephone Encounter (Signed)
Pt called and informed platelets are 73 and continue to hold Prednisone for now; repeat labs in  2 weeks per Dr Beryle Beams. Pt repeated instructions. And she will call back if need new rx to have labs drawn in [place].

## 2014-10-03 NOTE — Telephone Encounter (Signed)
-----   Message from Annia Belt, MD sent at 10/03/2014 10:46 AM EDT ----- OK to call her with this result - tell her we can hold off on steroids right now. Repeat count in 2 weeks. I will have to fax another Rx. ----- Message -----    From: Ebbie Latus, RN    Sent: 10/03/2014  10:30 AM      To: Annia Belt, MD  Dr Theodoro Kos Daza's platelets are 5 (states may be somewhat higher d/t aggregation of platelets in this sample) drawn yesterday.  Report in your mailbox. Thanks [employer]

## 2014-10-05 ENCOUNTER — Telehealth: Payer: Self-pay | Admitting: *Deleted

## 2014-10-05 NOTE — Telephone Encounter (Signed)
Call from pt - states will need another rx fax to her doctor's office [place]. States next lab draw will be on the 20th.

## 2014-10-09 NOTE — Telephone Encounter (Signed)
Yvonne Carrillo - I put fax # for her lab in my narcotics folder on your desk. Can you email # to me please DrG

## 2014-10-12 ENCOUNTER — Telehealth: Payer: Self-pay | Admitting: *Deleted

## 2014-10-12 NOTE — Telephone Encounter (Signed)
Returned pt's call - no answer, left message to call me back.

## 2014-10-15 ENCOUNTER — Telehealth: Payer: Self-pay | Admitting: *Deleted

## 2014-10-15 NOTE — Telephone Encounter (Signed)
No need to check iron: hemoglobin only mildly decreased and unchanged; normal sized red cells; she does not have iron deficiency. Also, I have not seen hair loss with iron deficiency

## 2014-10-15 NOTE — Telephone Encounter (Signed)
Returned pt's call - stated she's having severe hair loss and requesting her iron level be check tomorrow (Tuesday) at the same time CBC to be drawn. Stated she already had her thyroid checked in the past which was fine. Fax # for [facility] [phone].

## 2014-10-16 NOTE — Telephone Encounter (Signed)
Pt called / informed there's no need to check her iron level; Hgb only mildly decreased and unchanged; red cells are normal size and she doe not have an iron deficiency per Dr Beryle Beams. Also he has not seen hair loss w/ iron deficiency. Pt voiced understanding. Suggested talking to her PCP; stated she probably will b/c the hair loss is "really bad".

## 2014-10-17 ENCOUNTER — Telehealth: Payer: Self-pay | Admitting: *Deleted

## 2014-10-17 NOTE — Telephone Encounter (Signed)
-----   Message from Annia Belt, MD sent at 10/17/2014  1:13 PM EDT ----- OK to call patient with results.  We can still hold off on steroids. Check counts again in one-two months ----- Message -----    From: Ebbie Latus, RN    Sent: 10/17/2014   9:35 AM      To: Annia Belt, MD  Dr Darnell Level, Fax from [place] - pt's Platelets are 70, Hgb 11.4, PT 11.2 and INR 1.1. I will put fax in your mailbox. Thanks [employer]

## 2014-10-17 NOTE — Telephone Encounter (Signed)
Results called to pt -informed labs are OK and she can still hold her steroids and re-check counts in 1 -2 months per Dr Beryle Beams. [place] and she will call and let me know when she will have labs done so we can fax a new order to [place].

## 2014-11-01 ENCOUNTER — Telehealth: Payer: Self-pay | Admitting: *Deleted

## 2014-11-01 NOTE — Telephone Encounter (Signed)
Labs drawn on 10/30/14 at [facility] called to pt - no answer; left message - Platelet 77,00, WBC 3,800, RBC 4.15 and Hgb 12.2 per Dr Beryle Beams. And to call if she has any questions.

## 2014-11-13 LAB — PROTIME-INR

## 2014-11-23 ENCOUNTER — Telehealth: Payer: Self-pay | Admitting: *Deleted

## 2014-11-23 ENCOUNTER — Other Ambulatory Visit: Payer: Self-pay | Admitting: Oncology

## 2014-11-23 NOTE — Telephone Encounter (Signed)
Rx for labs (CBC, Diff) faxed to [facility], [place] per Dr Beryle Beams.

## 2014-12-07 ENCOUNTER — Telehealth: Payer: Self-pay | Admitting: *Deleted

## 2014-12-07 NOTE — Telephone Encounter (Signed)
Per Dr Beryle Beams, platelet count acceptable @ 58,000 and will continue to monitor. Pt called - no answer; left the above message; and to call if she has any questions.

## 2014-12-10 ENCOUNTER — Ambulatory Visit (INDEPENDENT_AMBULATORY_CARE_PROVIDER_SITE_OTHER): Payer: Medicare Other | Admitting: Oncology

## 2014-12-10 ENCOUNTER — Encounter: Payer: Self-pay | Admitting: Oncology

## 2014-12-10 VITALS — BP 148/55 | HR 84 | Temp 98.2°F | Ht 66.0 in | Wt 173.4 lb

## 2014-12-10 DIAGNOSIS — D61818 Other pancytopenia: Secondary | ICD-10-CM | POA: Diagnosis not present

## 2014-12-10 DIAGNOSIS — K7460 Unspecified cirrhosis of liver: Secondary | ICD-10-CM

## 2014-12-10 DIAGNOSIS — D696 Thrombocytopenia, unspecified: Secondary | ICD-10-CM | POA: Diagnosis not present

## 2014-12-10 NOTE — Patient Instructions (Addendum)
Continue CBC every 2 months through [facility] in [place], [place] Schedule ultrasound liver with elastography technique at [facility] this week if possible MD visit in 6 months

## 2014-12-10 NOTE — Progress Notes (Signed)
Patient ID: Yvonne Carrillo, female   DOB: [birth year], 69 y.o.   MRN: [id number] Hematology and Oncology Follow Up Visit  Parmis Farag [id number] 1945/09/01 69 y.o. 12/10/2014 5:45 PM   Principle Diagnosis: Encounter Diagnoses  Name Primary?  . Thrombocytopenia ([place])   . Other pancytopenia ([place])   . 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 Although I could not palpate any organomegaly on my initial exam, ultrasound of the abdomen done 04/12/2014 demonstrated splenomegaly without any lymphadenopathy. She had no peripheral lymphadenopathy. 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 Chemistry profile was normal except for random glucose 124.  No evidence for a hemolytic process with normal LDH 251, , bilirubin 0.8, and reticulocyte count 0.6%. I checked CMV and EBV viral titers. She had evidence for exposure to both of these viruses with high IgG titers but negative IgM titers. CT scan of the abdomen and a chest radiograph were done on 05/03/2014. CT findings consistent with hepatic cirrhosis. No focal liver masses. Evidence for recanalization of the umbilical veins and mild esophageal varices consistent with portal hypertension, and mild to moderate splenomegaly spleen measuring 15-16 centimeters in length also consistent with portal venous hypertension. No intra-abdominal adenopathy. No focal defects in the spleen.  At that point we had an explanation for her pancytopenia and splenomegaly-hepatic cirrhosis. She has no known exposures. She does not drink alcohol. She has never had a blood transfusion.  Hepatitis A, B, C, and HIV all  negative. Ceruloplasmin 28: normal ANA: negative; alpha-1-antitrypsin 121:normal;  anti-smooth muscle and anti-mitochondrial antibodies: not detected  I referred her to a gastroenterologist to consider a liver biopsy. This was not done. Her platelet count and white count began to fall lower than I would expect for nonalcoholic hepatitis down to 34,000. I suspected that she had an immune component to her leukopenia. I started her on a course of oral prednisone. She had a prompt improvement in her counts with peak platelet count up to 80,000. Steroids have now been tapered and she has been off steroids since the second week of August 2016. Both the platelet count and white count have fluctuated but overall platelets have remained over 58,000. Most recent value 56,000 on last week on 12/06/2014. White count initially normalized but last week's value drifting down again with total white count 3400, 62% neutrophils, 26 lymphocytes, 10 monocytes. She tolerated steroids poorly particularly with respect to her blood sugars. She sought consultation with Dr. Gillian Scarce who made recommendations on her diabetes medications. However she decided to stop all the steroids as of August 12.  She was concerned with hair loss which seemed to begin right around the time she stopped the steroids. She recently changed primary care physicians. Her new internist checked thyroid functions, cortisol levels, and iron levels all of which were normal.  Interim History:   She believes that her hair is starting to grow back but she is wearing a wig today. She has not had any clinical bleeding and denies epistaxis, gum bleeding, hematuria, vaginal bleeding, melena, or hematochezia. Her internist started her on biotin 5 mg daily, iron sulfate 325 mg twice daily, calcium citrate 600 mg twice a day, adamant D3 2000 international units daily. Diabetes medications adjusted.  glimiperide 1 mg daily added.  Medications:  reviewed  Allergies:  Allergies  Allergen Reactions  . Epinephrine Other (See Comments)    Increase heart rate  . Penicillins Rash    Review of Systems: See HPI Remaining ROS negative:   Physical Exam: Blood pressure 148/55, pulse 84, temperature 98.2 F (36.8 C), temperature source Oral, height 5\' 6"  (1.676 m), weight 173 lb 6.4 oz (78.654 kg), SpO2 100 %. Wt Readings from Last 3 Encounters:  12/10/14 173 lb 6.4 oz (78.654 kg)  09/18/14 171 lb 8 oz (77.792 kg)  05/08/14 179 lb (81.194 kg)     General appearance: well nourished Caucasian woman HENNT: Pharynx no erythema, exudate, mass, or ulcer. No thyromegaly or thyroid nodules Lymph nodes: No cervical, supraclavicular, or axillary lymphadenopathy Breasts:  Lungs: Clear to auscultation, resonant to percussion throughout Heart: Regular rhythm, no murmur, no gallop, no rub, no click, no edema Abdomen: Soft, nontender, normal bowel sounds, no mass, liver edge palpable right upper quadrant. No splenomegaly by my exam today. Extremities: No edema, no calf tenderness Musculoskeletal: no joint deformities GU:  Vascular: Carotid pulses 2+, no bruits,  Neurologic: Alert, oriented, PERRLA,  cranial nerves grossly normal, motor strength 5 over 5, reflexes 1+ symmetric, upper body coordination normal, gait normal, Skin: No rash or ecchymosis  Lab Results: CBC W/Diff    Component Value Date/Time   WBC 3.7 09/18/2014 0952   WBC 3.0* 05/08/2014 1644   RBC 3.80 09/18/2014 0952   RBC 4.27 05/08/2014 1644   RBC 4.08 03/05/2014 1403   HGB 11.5* 05/08/2014 1644   HCT 34.7 09/18/2014 0952   HCT 35.4* 05/08/2014 1644   PLT 34* 05/08/2014 1644   MCV 82.9 05/08/2014 1644   MCH 29.5 09/18/2014 0952   MCH 26.9 05/08/2014 1644   MCHC 32.3 09/18/2014 0952   MCHC 32.5 05/08/2014 1644   RDW 15.2 09/18/2014 0952   RDW 14.3 05/08/2014 1644   LYMPHSABS 0.8 09/18/2014 0952   LYMPHSABS 0.8 05/08/2014 1644   MONOABS 0.3 05/08/2014 1644    EOSABS 0.0 05/08/2014 1644   BASOSABS 0.0 09/18/2014 0952   BASOSABS 0.0 05/08/2014 1644     Chemistry      Component Value Date/Time   NA 146* 09/18/2014 0952   NA 140 05/02/2014 1507   K 4.0 09/18/2014 0952   CL 107 09/18/2014 0952   CO2 22 09/18/2014 0952   BUN 9 09/18/2014 0952   BUN 10 05/02/2014 1507   CREATININE 0.66 09/18/2014 0952      Component Value Date/Time   CALCIUM 9.1 09/18/2014 0952   ALKPHOS 75 09/18/2014 0952   AST 19 09/18/2014 0952   ALT 16 09/18/2014 0952   BILITOT 0.7 09/18/2014 0952   BILITOT 0.8 03/05/2014 1403       Radiological Studies: No results found.  Impression:  #1. Cryptogenic cirrhosis, nonalcoholic Evidence for previous exposure to CMV and EBV viruses although it is difficult to prove that these are the cause of her cirrhosis. There definitely appears to be an immune component despite the negative usual tests for autoimmune hepatitis,  as evidenced by a trilineage response to steroids. At the time I referred her to a gastroenterologist, platelets were at their lowest point and a liver biopsy was not advised. We discussed the potential value of doing this to establish a baseline, assess degree of hepatic damage, and assess for any component of inflammation that might be amenable to additional immunosuppressive treatment. She is still reluctant to get a  biopsy done at this time. I am going to go ahead and get an ultrasound elastography to assess for the degree of fibrosis in her liver. I do think that ultimately she will come to a liver biopsy. I told her it would be a good idea if she got vaccinated against hepatitis B and hepatitis A.  #2. Pancytopenia Multifactorial: Sequestration in an enlarged spleen, cirrhosis with decreased thrombopoietin production, and an immune component responsive to steroids. Currently off steroids. As long as platelet count remains above 30,000 we can continue observation alone. If we need to retreat, I would  use a short course of Decadron instead of a more prolonged course of prednisone. This should be equally efficacious and better tolerated. She will still need her sugars watched closely during the time she received the Decadron. If she needs a surgical procedure, I would use intravenous immunoglobulin for a rapid increase in her platelets to cover the surgery.  #3. Type 2 diabetes Currently being managed by her primary internist and a diabetes specialist. They will be alerted if we need to put her back on steroids.  Over 30 minutes spent with direct face-to-face patient contact including consultation, counseling, review of labs, and exam.   CC: No care team member to display   Annia Belt, MD 11/14/20165:45 PM

## 2014-12-11 ENCOUNTER — Telehealth: Payer: Self-pay | Admitting: *Deleted

## 2014-12-11 ENCOUNTER — Encounter: Payer: Medicare Other | Admitting: Oncology

## 2014-12-11 NOTE — Telephone Encounter (Signed)
Pt called informed of US Liver appt on Monday 11/21 @ 1130 AM with 1115 AM arrival to [facility] Radiology Dept; NPO x 6 hrs prior to test. And to call [phone] if need to reschedule.

## 2014-12-17 ENCOUNTER — Ambulatory Visit (HOSPITAL_COMMUNITY): Payer: Medicare Other

## 2015-01-01 ENCOUNTER — Ambulatory Visit (HOSPITAL_COMMUNITY)
Admission: RE | Admit: 2015-01-01 | Discharge: 2015-01-01 | Disposition: A | Discharge status: Home / Self Care | Payer: Medicare Other | Source: Ambulatory Visit | Attending: Oncology | Admitting: Oncology

## 2015-01-01 ENCOUNTER — Other Ambulatory Visit: Payer: Self-pay | Admitting: Oncology

## 2015-01-01 DIAGNOSIS — D696 Thrombocytopenia, unspecified: Secondary | ICD-10-CM | POA: Diagnosis not present

## 2015-01-01 DIAGNOSIS — I10 Essential (primary) hypertension: Secondary | ICD-10-CM | POA: Diagnosis not present

## 2015-01-01 DIAGNOSIS — K7460 Unspecified cirrhosis of liver: Secondary | ICD-10-CM | POA: Diagnosis present

## 2015-01-01 DIAGNOSIS — R161 Splenomegaly, not elsewhere classified: Secondary | ICD-10-CM | POA: Insufficient documentation

## 2015-01-04 ENCOUNTER — Telehealth: Payer: Self-pay | Admitting: *Deleted

## 2015-01-04 NOTE — Telephone Encounter (Signed)
-----   Message from Annia Belt, MD sent at 01/01/2015  2:29 PM EST ----- Call pt: moderate scarring in liver as expected. No focal mass in the liver

## 2015-01-04 NOTE — Telephone Encounter (Signed)
Pt called / informed abd u/s result "moderate scarring in liver as expected. No focal mass in the liver" per Dr Beryle Beams. Pt repeated result and had no questions.

## 2015-03-07 ENCOUNTER — Telehealth: Payer: Self-pay | Admitting: *Deleted

## 2015-03-07 NOTE — Telephone Encounter (Signed)
Order for CBC/Diff faxed to[facility] per Dr Beryle Beams; pt calld - no answer; message left.

## 2015-03-07 NOTE — Telephone Encounter (Signed)
Call from pt - needs a new order for CBC fax to [facility] [address].. Stated last CBC was done around December 5th -did not see in results media; she will have the doctor's office to re-fax the results. Thanks

## 2015-03-12 ENCOUNTER — Telehealth: Payer: Self-pay | Admitting: *Deleted

## 2015-03-12 NOTE — Telephone Encounter (Signed)
Called results from December labs - Platelets 78 and WBC 3.7 per Dr Beryle Beams which are ok. Pt 's husband stated she had labs done on Friday 2/10 and waiting on those results. Stated we will call her when we receive the results.

## 2015-03-13 ENCOUNTER — Telehealth: Payer: Self-pay | Admitting: *Deleted

## 2015-03-13 NOTE — Telephone Encounter (Signed)
Pt had called about lab results drawn 2/10. Returned pt' call - no answer. Left message we have not received any results from [facility] yet.

## 2015-03-14 NOTE — Telephone Encounter (Signed)
Pt called with lab results; labs are stable- per Dr Beryle Beams; will re-check lab in 2 months. Nn longer on Prednisone per pt.

## 2015-03-14 NOTE — Telephone Encounter (Signed)
-----   Message from Annia Belt, MD sent at 03/13/2015  5:18 PM EST ----- Can you please call her with results? Thanks DrG ----- Message -----    From: Ebbie Latus, RN    Sent: 03/13/2015   4:35 PM      To: Annia Belt, MD  Drg Received lab results from [facility] - Platelets 80 and WBC 5.2. I will put fax in you mailbox in [facility]. Thanks [employer]

## 2015-06-10 ENCOUNTER — Telehealth: Payer: Self-pay | Admitting: *Deleted

## 2015-06-10 NOTE — Telephone Encounter (Signed)
Called and informed pt platelets are 61,000 and WBC 3,200 per Dr Beryle Beams which she was already awared of. Stated her granddaughter was dx w/Hand/mouth/foot disease then she developed a rash and sorethroat; dx with Strep throat - given Rocephin and oral antibiotic.Re-peated  Labs on 5/11, platelets down to 53,000. MD believes she also has the same disease. Stated feels better- rash improved on hands, feet,body. And her doctor will repeat labs on 5/22.

## 2015-06-25 NOTE — Telephone Encounter (Signed)
Informed pt May 22 lab results have been received by Dr Darnell Level and reviewed.  Wants to know when to draw next labs at [facility] - will need new order fax to [phone]. Thanks

## 2015-07-03 NOTE — Telephone Encounter (Signed)
Done per Dr Beryle Beams.

## 2015-09-13 ENCOUNTER — Telehealth: Payer: Self-pay

## 2015-09-13 NOTE — Telephone Encounter (Signed)
Patient states she usually has a CBC every couple of months. The last one Dr. Beryle Beams had drawn was in Feb. Patient went to doctor in [place] who ordered CBC May 8,11, 22 of 2017. Results scanned into chart. Patient wanting to know if she needs to have recheck CBC. If so she said that the order is usually faxed to [facility] in [place]. Please advise.   FAX [phone]

## 2015-09-13 NOTE — Telephone Encounter (Signed)
Count has been stable but I can go ahead and fax order for a follow up CBC for week of 8/28

## 2015-09-13 NOTE — Telephone Encounter (Signed)
Needs to speak with a nurse regarding lab order. Please call pt back today.

## 2015-09-16 NOTE — Telephone Encounter (Signed)
Notified patient.

## 2015-09-25 ENCOUNTER — Telehealth: Payer: Self-pay | Admitting: *Deleted

## 2015-09-25 NOTE — Telephone Encounter (Signed)
Pt called /informed of results. She asked about RBC (4.13), HCt (35.6). Stated she will continue to have lab re-check every 2 months.

## 2015-09-25 NOTE — Telephone Encounter (Signed)
-----   Message from Annia Belt, MD sent at 09/25/2015  1:36 PM EDT ----- OK to call her with results ----- Message ----- From: Ebbie Latus, RN Sent: 09/25/2015  12:04 PM To: Annia Belt, MD  Dr Darnell Level, Fax from [place] - Platelet 64, WBC 4.0 HGB 11.5. I will put result in mailbox in [facility]. Thanks [employer]

## 2015-09-27 NOTE — Telephone Encounter (Signed)
She does not need to worry about those values: just means she is mildly anemic

## 2015-12-30 ENCOUNTER — Telehealth: Payer: Self-pay | Admitting: *Deleted

## 2015-12-30 NOTE — Telephone Encounter (Signed)
Lab results were faxed from [place] - drawn 11/29 and reviewed by Dr Beryle Beams. "Platelets 93,000 and WBC 4,000 - Great! And can check again in 3-4 months". Pt called - no one answered; left results on answering device; and informed to call for any questions. Results to be scanned into pt's chart.

## 2016-09-17 ENCOUNTER — Telehealth: Payer: Self-pay | Admitting: Internal Medicine

## 2016-09-24 NOTE — Telephone Encounter (Signed)
Dr.Perry reviewed records and accepted patient for colon in [place]. Spoke with patient who is requesting a morning in November. Will call patient back when November schedule comes out.

## 2016-09-29 ENCOUNTER — Encounter: Payer: Self-pay | Admitting: Internal Medicine

## 2016-11-17 ENCOUNTER — Encounter: Payer: Self-pay | Admitting: *Deleted

## 2016-11-17 ENCOUNTER — Ambulatory Visit (AMBULATORY_SURGERY_CENTER): Payer: Self-pay | Admitting: *Deleted

## 2016-11-17 ENCOUNTER — Encounter: Payer: Self-pay | Admitting: Internal Medicine

## 2016-11-17 VITALS — Ht 66.0 in | Wt 186.2 lb

## 2016-11-17 DIAGNOSIS — Z86010 Personal history of colonic polyps: Secondary | ICD-10-CM

## 2016-11-17 MED ORDER — NA SULFATE-K SULFATE-MG SULF 17.5-3.13-1.6 GM/177ML PO SOLN: 1.0000 [IU] | Freq: Once | ORAL | 0 refills | Status: AC

## 2016-11-17 NOTE — Progress Notes (Signed)
No egg or soy allergy known to patient  No issues with past sedation with any surgeries  or procedures, no intubation problems  Not with colonoscopy done in [place], [place] some problems with nausea with General Anesthesia No diet pills per patient No home 02 use per patient  No blood thinners per patient  Pt denies issues with constipation  No A fib or A flutter  EMMI video sent to pt's e mail

## 2016-11-24 ENCOUNTER — Telehealth: Payer: Self-pay | Admitting: *Deleted

## 2016-11-24 NOTE — Telephone Encounter (Signed)
Pt stated she had labs done about 3 weeks and wanted to know if Dr Beryle Beams had received them  I did not see them in his mailbox nor in her chart (under media). Stated she will have them re-sent.

## 2016-11-25 ENCOUNTER — Other Ambulatory Visit (INDEPENDENT_AMBULATORY_CARE_PROVIDER_SITE_OTHER): Payer: Medicare Other

## 2016-11-25 ENCOUNTER — Encounter: Payer: Self-pay | Admitting: Physician Assistant

## 2016-11-25 ENCOUNTER — Ambulatory Visit (INDEPENDENT_AMBULATORY_CARE_PROVIDER_SITE_OTHER): Payer: Medicare Other | Admitting: Physician Assistant

## 2016-11-25 VITALS — BP 152/80 | HR 80 | Ht 66.0 in | Wt 185.2 lb

## 2016-11-25 DIAGNOSIS — K7469 Other cirrhosis of liver: Secondary | ICD-10-CM

## 2016-11-25 DIAGNOSIS — K766 Portal hypertension: Secondary | ICD-10-CM

## 2016-11-25 DIAGNOSIS — Z860100 Personal history of colon polyps, unspecified: Secondary | ICD-10-CM

## 2016-11-25 DIAGNOSIS — E119 Type 2 diabetes mellitus without complications: Secondary | ICD-10-CM | POA: Insufficient documentation

## 2016-11-25 DIAGNOSIS — I8500 Esophageal varices without bleeding: Secondary | ICD-10-CM

## 2016-11-25 DIAGNOSIS — D696 Thrombocytopenia, unspecified: Secondary | ICD-10-CM

## 2016-11-25 LAB — CBC WITH DIFFERENTIAL/PLATELET
BASOS ABS: 0 10*3/uL (ref 0.0–0.1)
BASOS PCT: 0.5 % (ref 0.0–3.0)
EOS ABS: 0.1 10*3/uL (ref 0.0–0.7)
Eosinophils Relative: 2.5 % (ref 0.0–5.0)
HCT: 37.7 % (ref 36.0–46.0)
HEMOGLOBIN: 12.3 g/dL (ref 12.0–15.0)
Lymphocytes Relative: 20.7 % (ref 12.0–46.0)
Lymphs Abs: 0.9 10*3/uL (ref 0.7–4.0)
MCHC: 32.7 g/dL (ref 30.0–36.0)
MCV: 86.5 fl (ref 78.0–100.0)
MONO ABS: 0.5 10*3/uL (ref 0.1–1.0)
Monocytes Relative: 10.5 % (ref 3.0–12.0)
NEUTROS PCT: 65.8 % (ref 43.0–77.0)
Neutro Abs: 3 10*3/uL (ref 1.4–7.7)
Platelets: 78 10*3/uL — ABNORMAL LOW (ref 150.0–400.0)
RBC: 4.36 Mil/uL (ref 3.87–5.11)
RDW: 13.9 % (ref 11.5–15.5)
WBC: 4.6 10*3/uL (ref 4.0–10.5)

## 2016-11-25 LAB — PROTIME-INR
INR: 1.2 ratio — AB (ref 0.8–1.0)
PROTHROMBIN TIME: 12.9 s (ref 9.6–13.1)

## 2016-11-25 LAB — FERRITIN: Ferritin: 57.5 ng/mL (ref 10.0–291.0)

## 2016-11-25 LAB — AMMONIA: AMMONIA: 37 umol/L — AB (ref 11–35)

## 2016-11-25 NOTE — Progress Notes (Addendum)
Subjective:    Patient ID: Yvonne Carrillo, female    DOB: 05-19-1945, 71 y.o.   MRN: [id number]  HPI Yvonne Carrillo is a pleasant 71 year-old white female, new to GI, referred by her PCP Dr. Shon Millet Angelina Sheriff [place] for follow-up colonoscopy. Patient relates history of prior colonoscopy done by Dr. Algis Greenhouse in 2013 with finding of an adenomatous colon polyp. We have received those records and she had a 7 mm sigmoid colon polyp removed in 2013 which was an adenoma and also had left colon diverticulosis. Patient was scheduled for previsit for colonoscopy, and because of finding of thrombocytopenia office visit was suggested first. Colonoscopy is scheduled for Dr. Henrene Pastor on 12/01/2016. Patient has history of cryptogenic cirrhosis with diagnosis made in 2016 by Dr. Beryle Beams  by review of her records. At that time she had been referred for thrombocytopenia. She did have a bone marrow biopsy done,-which showed slightly hypercellular bone marrow for age with trilineage hematopoiesis. Also was noted to have splenomegaly. There was concern about an autoimmune component. She did undergo ultrasound with the last time her feet which showed a nodular liver consistent with cirrhosis, recanalized umbilical vein, enlarged spleen measuring 14.3 cm and she had a negative ear fibrosis score of F2-F3. CT scan of the abdomen and pelvis done April 2016 also showed hepatic cirrhosis, venous hypertension mild esophageal varices and mild to moderate splenomegaly no ascites, diverticulosis. Previous hepatitis A, B, and C serologies negative, HIV negative, ceruloplasmin 28/normal, a N/A negative, alpha-1 antitrypsin normal, anti-smooth muscle antibody and antimitochondrial antibodies not detected. Patient has not had prior EGD. She has not had any follow-up for cirrhosis since 2016. Most recent labs 10/23/2016-WBC 3.7, hemoglobin 11.2, hematocrit 34.3, platelets 62, sodium 139, potassium 4.2, BUN 12, creatinine 0.76, T bili 0.6  alkaline phosphatase 72, AST 29.  Review of Systems Pertinent positive and negative review of systems were noted in the above HPI section.  All other review of systems was otherwise negative.  Outpatient Encounter Prescriptions as of 11/25/2016  Medication Sig  . calcium citrate-vitamin D (CITRACAL+D) 315-200 MG-UNIT tablet Take 1 tablet by mouth 2 (two) times daily.  . cholecalciferol (VITAMIN D) 400 UNITS TABS tablet Take 2,000 Units by mouth daily.  Marland Kitchen glimepiride (AMARYL) 1 MG tablet Take 0.5 mg by mouth daily with breakfast.   . irbesartan (AVAPRO) 150 MG tablet Take 150 mg by mouth daily.  . simvastatin (ZOCOR) 40 MG tablet Take 20 mg by mouth at bedtime.  . sitaGLIPtin-metformin (JANUMET) 50-1000 MG per tablet Take 1 tablet by mouth daily.    No facility-administered encounter medications on file as of 11/25/2016.    Allergies  Allergen Reactions  . Epinephrine Other (See Comments)    Increase heart rate  . Penicillins Rash   Patient Active Problem List   Diagnosis Date Noted  . Portal hypertension with esophageal varices ([facility]) 11/25/2016  . Hx of adenomatous colonic polyps 11/25/2016  . Diabetes ([place]) 11/25/2016  . Hepatic cirrhosis ([place]) 05/08/2014  . Other pancytopenia (Yvonne Carrillo)   . Thrombocytopenia ([place]) 03/05/2014  . Chronic interstitial cystitis 03/05/2014   Social History   Social History  . Marital status: Married    Spouse name: N/A  . Number of children: N/A  . Years of education: N/A   Occupational History  . Not on file.   Social History Main Topics  . Smoking status: Never Smoker  . Smokeless tobacco: Never Used  . Alcohol use No  . Drug use: No  . Sexual activity:  Not on file   Other Topics Concern  . Not on file   Social History Narrative  . No narrative on file    Ms. Rilling's family history includes COPD in her mother; Heart disease in her father.      Objective:    Vitals:   11/25/16 1340  BP: (!) 152/80  Pulse: 80    Physical  Exam well-developed older white female in no acute distress, pleasant, anxious accompanied by her husband blood pressure 152/80 pulse 80, height 5 foot 6, weight 185, BMI 29.8 HEENT ;nontraumatic normocephalic EOMI PERRLA sclera anicteric, Cardiovascular; regular rate and rhythm with S1-S2 no murmur or gallop, Pulmonary; clear bilaterally she has some kyphosis, Abdomen; soft, nondistended no appreciable ascites no palpable hepatomegaly, spleen tip is palpable in the left upper quadrant with deep palpation, bowel sounds present, Rectal; exam not done, Extremities; patient wearing compression stockings no edema, Neuropsych; mood and affect appropriate       Assessment & Plan:   #50 71 year old white female with diagnosis of cryptogenic cirrhosis 2016-probably on the basis of nonalcoholic fatty liver disease. Disease complicated by thrombocytopenia, portal hypertension , splenomegaly and prior imaging showing evidence of esophageal varices.  #2 history of adenomatous colon polyps-last colonoscopy 2013, due for follow-up and scheduled with Dr. Henrene Pastor on 12/01/2016 #3 esophageal variceal surveillance-no prior EGD #4 [place] Carrillo surveillance-needs follow-up ultrasound and AFP #5 adult-onset diabetes mellitus #6 history of I.C.  Plan; Will check CBC with differential today-okay for colonoscopy as long as platelet count has remained stable in the 60,000 range Check ProTime/INR, venous ammonia, ferritin, alpha-fetoprotein. Schedule for upper abdominal ultrasound-patient indicated she wanted to check her schedule and will call back to schedule. We discussed indication for upper endoscopy, for esophageal variceal surveillance, and possible treatment with banding should she have very large varices. We also discussed treatment with beta blockers. She is very hesitant to proceed with EGD at this time. This had not been discussed previously with her. We'll get her through colonoscopy first and this can be discussed and  scheduled per Dr. Henrene Pastor for later date.   Amy S Esterwood PA-C 11/25/2016   Cc: Tobe Sos, MD

## 2016-11-25 NOTE — Patient Instructions (Signed)
Please go to the basement level to have your labs drawn.   Call us back to scheduled the abdominal Ultrasound.

## 2016-11-26 LAB — AFP TUMOR MARKER: AFP-Tumor Marker: 2 ng/mL

## 2016-11-26 NOTE — Progress Notes (Signed)
Assessment and plans reviewed  

## 2016-11-27 NOTE — Telephone Encounter (Signed)
Nothing in my box on 11/1

## 2016-11-30 NOTE — Telephone Encounter (Signed)
Called pt - fax received, informed platelet count 62,000, WBC 3.7 and Hgb 11.2 per Dr Beryle Beams. No new orders.

## 2016-11-30 NOTE — Telephone Encounter (Signed)
We have not received lab results yet; called pt - stated she will call her doctor's office again.

## 2016-12-01 ENCOUNTER — Encounter: Payer: Self-pay | Admitting: Internal Medicine

## 2016-12-01 ENCOUNTER — Telehealth: Payer: Self-pay | Admitting: Internal Medicine

## 2016-12-01 ENCOUNTER — Ambulatory Visit (AMBULATORY_SURGERY_CENTER): Payer: Medicare Other | Admitting: Internal Medicine

## 2016-12-01 VITALS — BP 130/66 | HR 78 | Temp 97.1°F | Resp 17 | Ht 66.0 in | Wt 185.0 lb

## 2016-12-01 DIAGNOSIS — K635 Polyp of colon: Secondary | ICD-10-CM | POA: Diagnosis not present

## 2016-12-01 DIAGNOSIS — Z86010 Personal history of colonic polyps: Secondary | ICD-10-CM

## 2016-12-01 DIAGNOSIS — D125 Benign neoplasm of sigmoid colon: Secondary | ICD-10-CM

## 2016-12-01 DIAGNOSIS — D124 Benign neoplasm of descending colon: Secondary | ICD-10-CM

## 2016-12-01 MED ORDER — SODIUM CHLORIDE 0.9 % IV SOLN: 500.0000 mL | INTRAVENOUS | Status: DC

## 2016-12-01 NOTE — Progress Notes (Signed)
Spontaneous respirations throughout. VSS. Resting comfortably. To PACU on room air. Report to  RN. 

## 2016-12-01 NOTE — Patient Instructions (Signed)
YOU HAD AN ENDOSCOPIC PROCEDURE TODAY AT [facility]:   Refer to the procedure report that was given to you for any specific questions about what was found during the examination.  If the procedure report does not answer your questions, please call your gastroenterologist to clarify.  If you requested that your care partner not be given the details of your procedure findings, then the procedure report has been included in a sealed envelope for you to review at your convenience later.  YOU SHOULD EXPECT: Some feelings of bloating in the abdomen. Passage of more gas than usual.  Walking can help get rid of the air that was put into your GI tract during the procedure and reduce the bloating. If you had a lower endoscopy (such as a colonoscopy or flexible sigmoidoscopy) you may notice spotting of blood in your stool or on the toilet paper. If you underwent a bowel prep for your procedure, you may not have a normal bowel movement for a few days.  Please Note:  You might notice some irritation and congestion in your nose or some drainage.  This is from the oxygen used during your procedure.  There is no need for concern and it should clear up in a day or so.  SYMPTOMS TO REPORT IMMEDIATELY:   Following lower endoscopy (colonoscopy or flexible sigmoidoscopy):  Excessive amounts of blood in the stool  Significant tenderness or worsening of abdominal pains  Swelling of the abdomen that is new, acute  Fever of 100F or higher   For urgent or emergent issues, a gastroenterologist can be reached at any hour by calling [phone].   DIET:  We do recommend a small meal at first, but then you may proceed to your regular diet.  Drink plenty of fluids but you should avoid alcoholic beverages for 24 hours. Try to increase the fiber in your diet, and drink plenty of water.  ACTIVITY:  You should plan to take it easy for the rest of today and you should NOT DRIVE or use heavy machinery until  tomorrow (because of the sedation medicines used during the test).    FOLLOW UP: Our staff will call the number listed on your records the next business day following your procedure to check on you and address any questions or concerns that you may have regarding the information given to you following your procedure. If we do not reach you, we will leave a message.  However, if you are feeling well and you are not experiencing any problems, there is no need to return our call.  We will assume that you have returned to your regular daily activities without incident.  If any biopsies were taken you will be contacted by phone or by letter within the next 1-3 weeks.  Please call us at [phone] if you have not heard about the biopsies in 3 weeks.    SIGNATURES/CONFIDENTIALITY: You and/or your care partner have signed paperwork which will be entered into your electronic medical record.  These signatures attest to the fact that that the information above on your After Visit Summary has been reviewed and is understood.  Full responsibility of the confidentiality of this discharge information lies with you and/or your care-partner.  If you want for Dr. Henrene Pastor to follow your liver issues, come to see [place] in the office in 1 year.  If noit, see your PCP.   You will need another colonoscopy in 5 years per Dr. Henrene Pastor.

## 2016-12-01 NOTE — Progress Notes (Signed)
Patient unable to void post procedure. She does have interstitial cystitis.Husband states that she always have difficulty "peeing" after the procedures.  She is walking the unit to see if she can void.  Unsuccessful. Patient inn holding area and she is drinking coffee to see if it helps.  IF not, will discharge home.  Unable to cath patient here per protocol.  Patient was able to void after walking and drinking a cup of coffee.

## 2016-12-01 NOTE — Telephone Encounter (Signed)
Spoke with pt and states she has passed a "little bit of blood, maybe a teaspoon" since her procedure.  I told her that some spotting of blood is common.  I instructed her to call back if she passes a larger amount, like 1/2 cup or starts passing clots.  Understanding voiced

## 2016-12-01 NOTE — Op Note (Signed)
[place] Patient Name: Yvonne Carrillo Procedure Date: 12/01/2016 8:47 AM MRN: [id number] Endoscopist: Docia Chuck. Henrene Pastor , MD Age: 71 Referring MD:  Date of Birth: Dec 19, 1945 Gender: Female Account #: [id number] Procedure:                Colonoscopy, with cold snare polypectomy x 2 Indications:              High risk colon cancer surveillance: Personal                            history of non-advanced adenoma. Previous                            examination 2013 in [place]:                Monitored Anesthesia Care Procedure:                Pre-Anesthesia Assessment:                           - Prior to the procedure, a History and Physical                            was performed, and patient medications and                            allergies were reviewed. The patient's tolerance of                            previous anesthesia was also reviewed. The risks                            and benefits of the procedure and the sedation                            options and risks were discussed with the patient.                            All questions were answered, and informed consent                            was obtained. Prior Anticoagulants: The patient has                            taken no previous anticoagulant or antiplatelet                            agents. ASA Grade Assessment: III - A patient with                            severe systemic disease. After reviewing the risks                            and benefits, the patient was deemed in  satisfactory condition to undergo the procedure.                           After obtaining informed consent, the colonoscope                            was passed under direct vision. Throughout the                            procedure, the patient's blood pressure, pulse, and                            oxygen saturations were monitored continuously. The   Colonoscope was introduced through the anus and                            advanced to the the cecum, identified by                            appendiceal orifice and ileocecal valve. The                            ileocecal valve, appendiceal orifice, and rectum                            were photographed. The quality of the bowel                            preparation was excellent. The colonoscopy was                            performed without difficulty. The patient tolerated                            the procedure well. The bowel preparation used was                            SUPREP. Scope In: 8:58:15 AM Scope Out: 9:16:55 AM Scope Withdrawal Time: 0 hours 13 minutes 47 seconds  Total Procedure Duration: 0 hours 18 minutes 40 seconds  Findings:                 Two polyps were found in the sigmoid colon and                            descending colon. The polyps were 1 to 4 mm in                            size. These polyps were removed with a cold snare.                            Resection and retrieval were complete.                           Multiple diverticula were found  in the left colon.                           External and internal hemorrhoids were found during                            retroflexion.                           The exam was otherwise without abnormality on                            direct and retroflexion views. Complications:            No immediate complications. Estimated blood loss:                            None. Estimated Blood Loss:     Estimated blood loss: none. Impression:               - Two 1 to 4 mm polyps in the sigmoid colon and in                            the descending colon, removed with a cold snare.                            Resected and retrieved.                           - Diverticulosis in the left colon.                           - External and internal hemorrhoids.                           - The examination was  otherwise normal on direct                            and retroflexion views. Recommendation:           - Repeat colonoscopy in 5 years if polyp                            adenomatous. Otherwise no further surveillance                            required.                           - Patient has a contact number available for                            emergencies. The signs and symptoms of potential                            delayed complications were discussed with the  patient. Return to normal activities tomorrow.                            Written discharge instructions were provided to the                            patient.                           - Resume previous diet.                           - Continue present medications.                           - Await pathology results. We will send you a                            letter with the results and follow-up                            recommendation if any.                           - If you wish to have your liver disease followed                            in this clinic, please return in 1 year. Otherwise                            resume care with your primary provider in [place]. Docia Chuck. Henrene Pastor, MD 12/01/2016 9:24:48 AM This report has been signed electronically.

## 2016-12-01 NOTE — Progress Notes (Signed)
Called to room to assist during endoscopic procedure.  Patient ID and intended procedure confirmed with present staff. Received instructions for my participation in the procedure from the performing physician.  

## 2016-12-01 NOTE — Progress Notes (Signed)
Pt's states no medical or surgical changes since previsit or office visit. 

## 2016-12-02 ENCOUNTER — Telehealth: Payer: Self-pay | Admitting: *Deleted

## 2016-12-02 NOTE — Telephone Encounter (Signed)
  Follow up Call-  Call back number 12/01/2016  Post procedure Call Back phone  # [phone]  Permission to leave phone message Yes  Some recent data might be hidden     Patient questions:  Do you have a fever, pain , or abdominal swelling? No. Pain Score  0 *  Have you tolerated food without any problems? Yes.    Have you been able to return to your normal activities? Yes.    Do you have any questions about your discharge instructions: Diet   No. Medications  Yes.   Follow up visit  No.  Do you have questions or concerns about your Care? No.  Actions: * If pain score is 4 or above: No action needed, pain <4.  Pt. Had questions about taking her diabetic medications. Informed pt. That she was to resume medications per report,pt. Verbalize understanding.

## 2016-12-03 ENCOUNTER — Encounter: Payer: Self-pay | Admitting: Internal Medicine

## 2017-05-06 ENCOUNTER — Other Ambulatory Visit: Payer: Self-pay | Admitting: Oncology

## 2017-05-06 ENCOUNTER — Telehealth: Payer: Self-pay | Admitting: *Deleted

## 2017-05-06 DIAGNOSIS — K7460 Unspecified cirrhosis of liver: Secondary | ICD-10-CM

## 2017-05-06 DIAGNOSIS — D61818 Other pancytopenia: Secondary | ICD-10-CM

## 2017-05-06 DIAGNOSIS — D696 Thrombocytopenia, unspecified: Secondary | ICD-10-CM

## 2017-05-06 NOTE — Telephone Encounter (Signed)
I called Dr Shon Millet. He will start decadron 20 mg BID x 4 days; PPI; insulin. Message sent to [facility] to get her in for a visit.  Thx DrG

## 2017-05-06 NOTE — Telephone Encounter (Signed)
Call from pt - stated her platelet count is down to 22 and needs to be seen by Dr Beryle Beams as soon as possible. Also stated her PCp, Dr Shon Millet, has been trying to get in contact w/Dr G and the office has faxed lab results this morning.  I checked fax machine - rec lab results. On 4/8 Plt 22 and 4/9 27. Told her I have her results and will let Dr Darnell Level know.  Talked to Dr Jola Babinski - informed of results; given phone# to call Dr Shon Millet as on fax.

## 2017-05-07 NOTE — Telephone Encounter (Signed)
Per Epic, appt has been scheduled for 4/22 with Dr Darnell Level.

## 2017-05-10 ENCOUNTER — Encounter: Payer: Medicare Other | Admitting: Oncology

## 2017-05-10 IMAGING — US US ABDOMEN COMPLETE W/ ELASTOGRAPHY
1 series · 13 of 19 positions shown · non-contrast
Comparison: CT of the abdomen and pelvis 05/03/2014

CLINICAL DATA: Diagnosis of cirrhosis [REDACTED]. Thrombocytopenia and
hypertension.



[Series 1: us abdomen complete w/ elastography · 0.17mm/px · 13 of 19 slices shown]
[im 1/19]
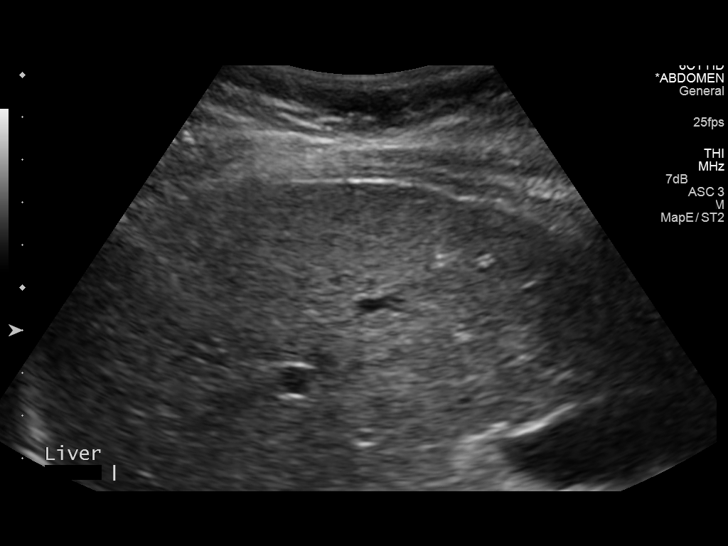
[im 3/19]
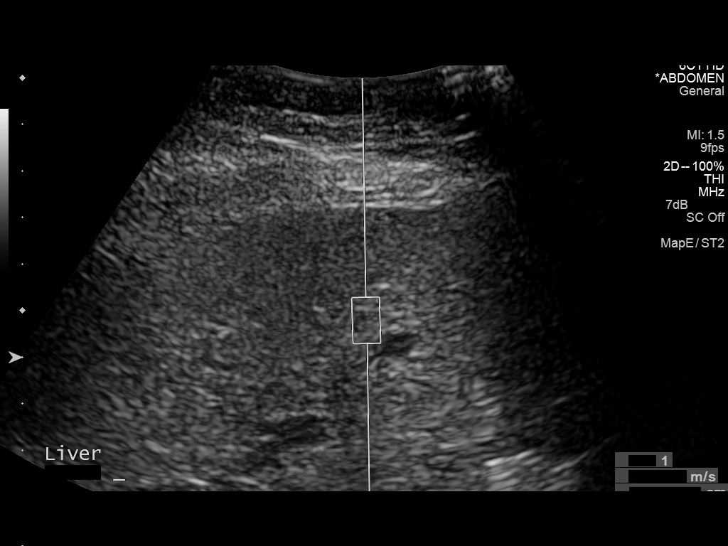
[im 4/19]
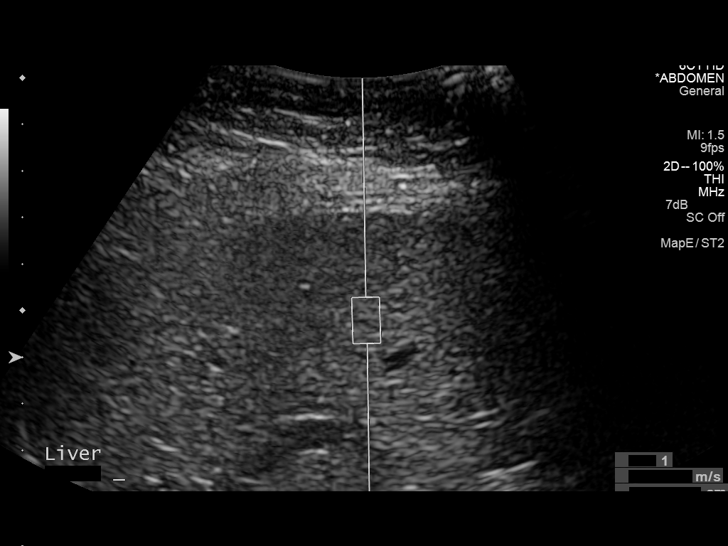
[im 6/19]
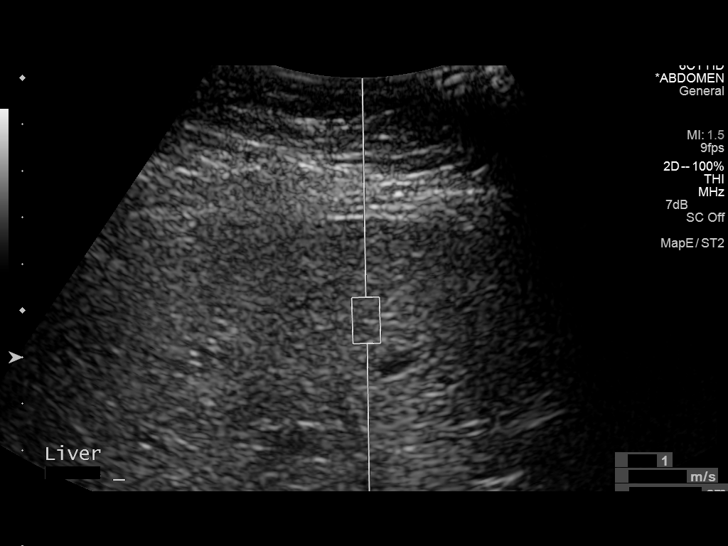
[im 7/19]
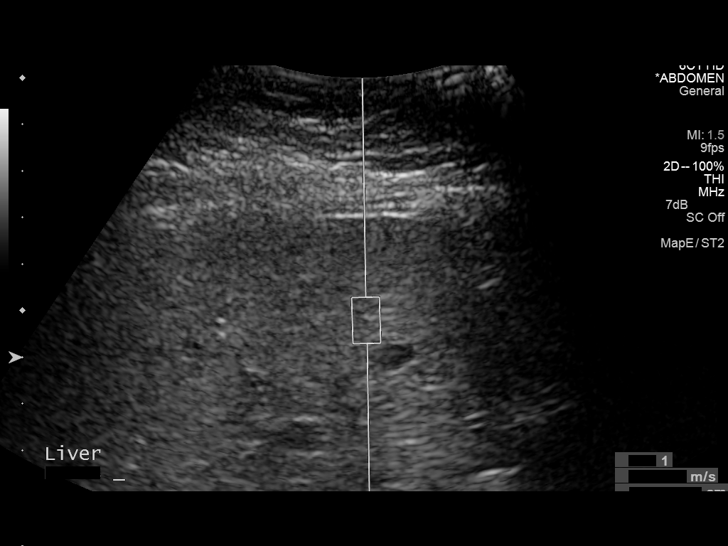
[im 9/19]
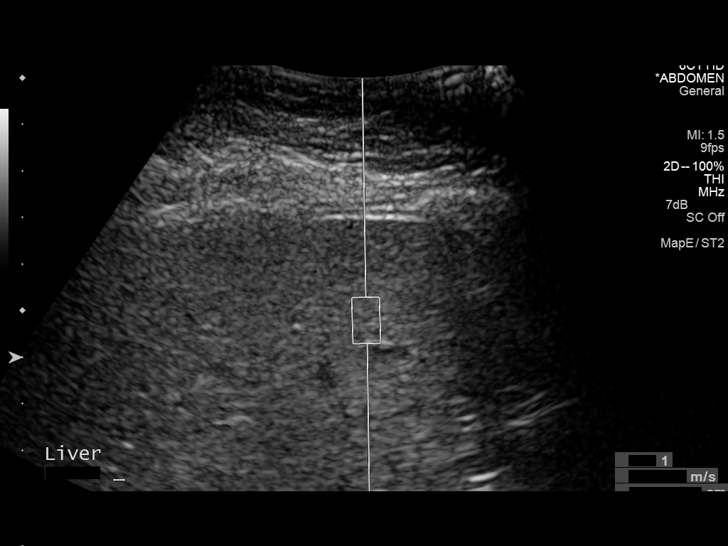
[im 10/19]
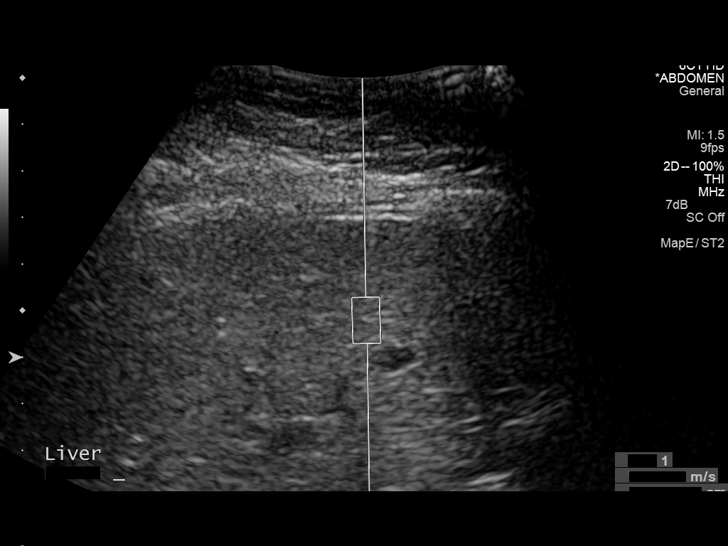
[im 11/19]
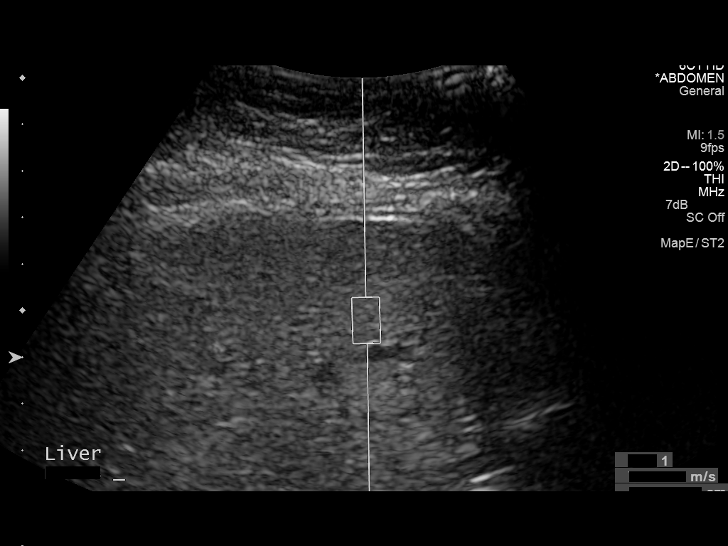
[im 13/19]
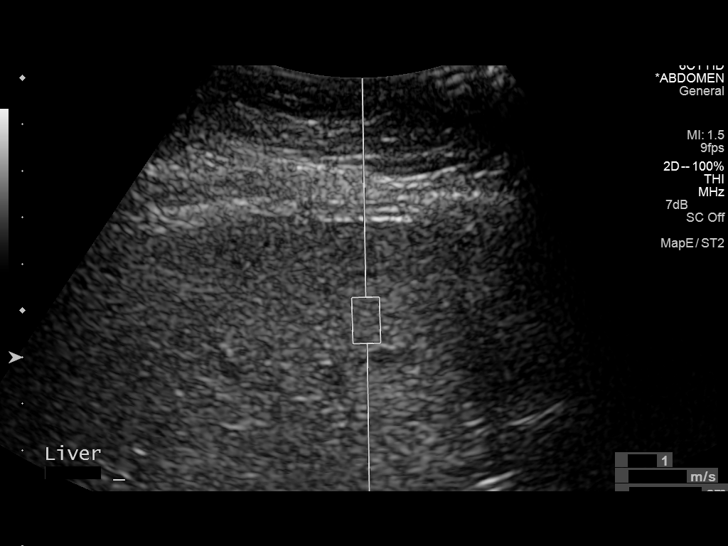
[im 14/19]
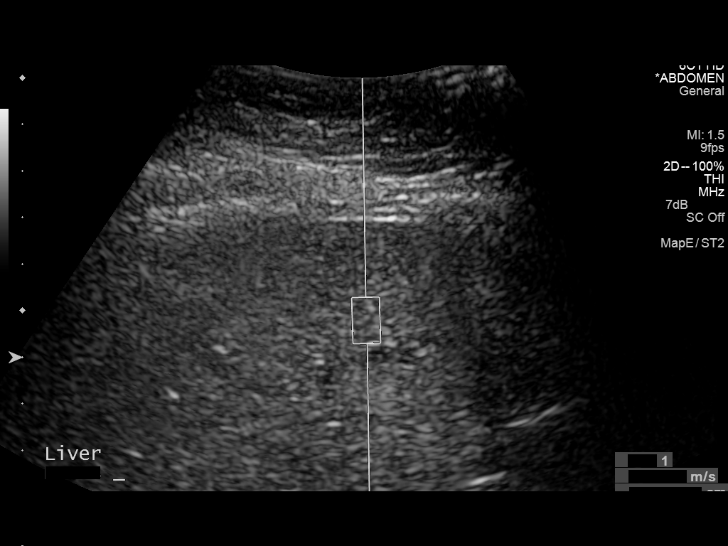
[im 16/19]
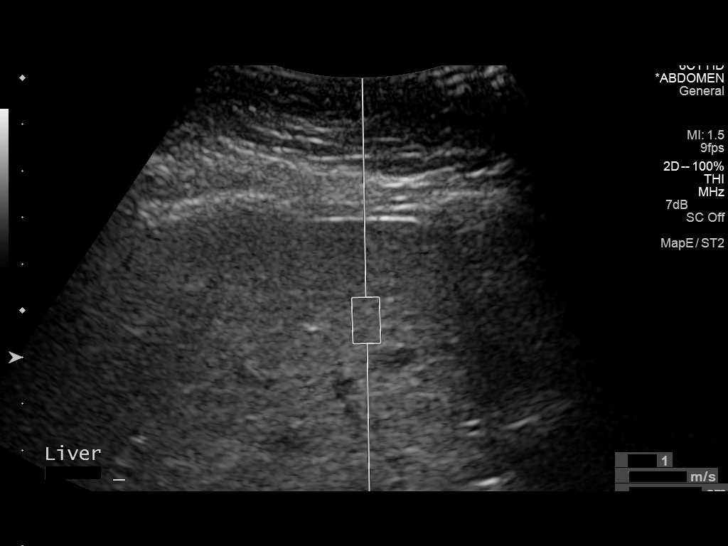
[im 17/19]
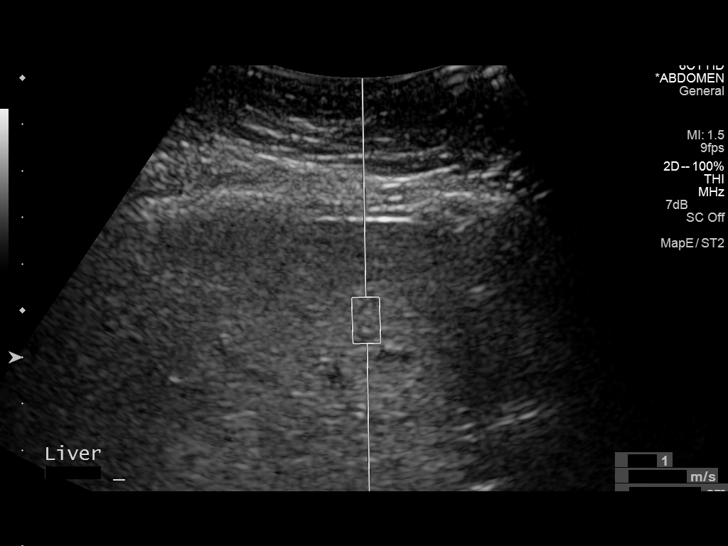
[im 19/19]
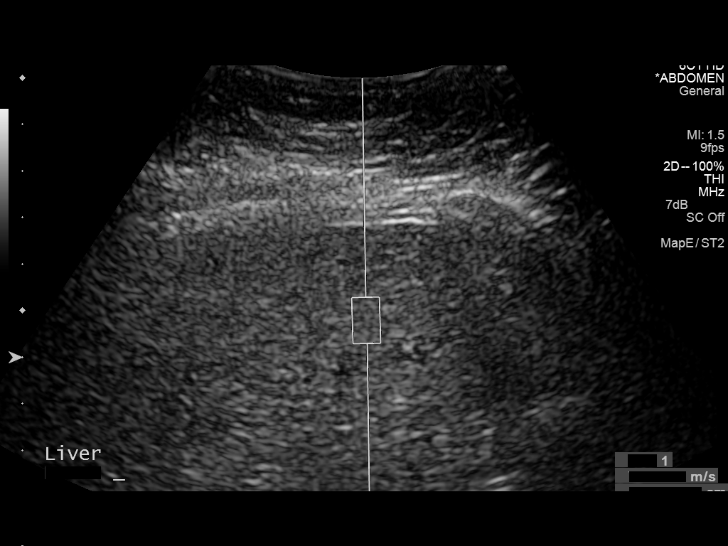

[13 of 19 positions shown; findings below may reference images not displayed]

FINDINGS: ULTRASOUND ABDOMEN

Gallbladder: Gallbladder has a normal appearance. Gallbladder wall
is 2.0 mm, within normal limits. No stones or pericholecystic fluid.
No sonographic Murphy's sign.

Common bile duct: Diameter: 3.9 mm

Liver: Nodular and heterogeneous in echotexture. No focal liver
lesions are identified. Note is made of recanalized umbilical vein.

IVC: No abnormality visualized.

Pancreas: No abnormality visualized.

Spleen: 14.3 cm in length. Splenic volume is 803.6 cubic cm. No
focal splenic lesion.

Right Kidney: Length: 11.6 cm. Echogenicity within normal limits. No
mass or hydronephrosis visualized.

Left Kidney: Length: 13.2 cm. Echogenicity within normal limits. No
mass or hydronephrosis visualized.

Abdominal aorta: No aneurysm visualized.

Other findings: None.

ULTRASOUND HEPATIC ELASTOGRAPHY

Device: Siemens Helix VTQ

Transducer 6 C1

Patient position: Left lateral decubitus

Number of measurements:  10

Hepatic Segment:  Eight

Median velocity:   1.90  m/sec

IQR:

IQR/Median velocity ratio

Corresponding Metavir fibrosis score:  F2 and some F3

Risk of fibrosis: Moderate

Limitations of exam: None

Pertinent findings noted on other imaging exams:  None

Please note that abnormal shear wave velocities may also be
identified in clinical settings other than with hepatic fibrosis,
such as: acute hepatitis, elevated right heart and central venous
pressures including use of beta blockers, Luumbardh disease
([place]), infiltrative processes such as
mastocytosis/amyloidosis/infiltrative tumor, extrahepatic
cholestasis, in the post-prandial state, and liver transplantation.
Correlation with patient history, laboratory data, and clinical
condition recommended.
IMPRESSION: 1. Cirrhotic contour of the liver.
2. Splenomegaly and recanalized umbilical vein consistent with
portal venous hypertension.
3. Median hepatic shear wave velocity is calculated at 1.90 m/sec.
4. Corresponding Metavir fibrosis score is F2 and some F3.
5. Risk of fibrosis is moderate.
6. Follow-up:  Additional testing is considered appropriate.

## 2017-05-17 ENCOUNTER — Encounter: Payer: Self-pay | Admitting: Oncology

## 2017-05-17 ENCOUNTER — Ambulatory Visit: Payer: Medicare Other | Admitting: Oncology

## 2017-05-17 ENCOUNTER — Other Ambulatory Visit: Payer: Self-pay

## 2017-05-17 VITALS — BP 132/55 | HR 74 | Temp 97.7°F | Ht 66.0 in | Wt 178.0 lb

## 2017-05-17 DIAGNOSIS — Z79899 Other long term (current) drug therapy: Secondary | ICD-10-CM

## 2017-05-17 DIAGNOSIS — I10 Essential (primary) hypertension: Secondary | ICD-10-CM

## 2017-05-17 DIAGNOSIS — Z888 Allergy status to other drugs, medicaments and biological substances status: Secondary | ICD-10-CM

## 2017-05-17 DIAGNOSIS — R161 Splenomegaly, not elsewhere classified: Secondary | ICD-10-CM

## 2017-05-17 DIAGNOSIS — Z880 Allergy status to penicillin: Secondary | ICD-10-CM

## 2017-05-17 DIAGNOSIS — G4700 Insomnia, unspecified: Secondary | ICD-10-CM | POA: Diagnosis not present

## 2017-05-17 DIAGNOSIS — Z86010 Personal history of colonic polyps: Secondary | ICD-10-CM | POA: Diagnosis not present

## 2017-05-17 DIAGNOSIS — K7469 Other cirrhosis of liver: Secondary | ICD-10-CM

## 2017-05-17 DIAGNOSIS — Z87440 Personal history of urinary (tract) infections: Secondary | ICD-10-CM

## 2017-05-17 DIAGNOSIS — I8510 Secondary esophageal varices without bleeding: Secondary | ICD-10-CM | POA: Diagnosis not present

## 2017-05-17 DIAGNOSIS — K766 Portal hypertension: Secondary | ICD-10-CM | POA: Diagnosis not present

## 2017-05-17 DIAGNOSIS — E119 Type 2 diabetes mellitus without complications: Secondary | ICD-10-CM | POA: Diagnosis not present

## 2017-05-17 DIAGNOSIS — D61818 Other pancytopenia: Secondary | ICD-10-CM | POA: Diagnosis not present

## 2017-05-17 DIAGNOSIS — I8500 Esophageal varices without bleeding: Secondary | ICD-10-CM

## 2017-05-17 DIAGNOSIS — D696 Thrombocytopenia, unspecified: Secondary | ICD-10-CM

## 2017-05-17 DIAGNOSIS — K7460 Unspecified cirrhosis of liver: Secondary | ICD-10-CM | POA: Diagnosis not present

## 2017-05-17 DIAGNOSIS — N3010 Interstitial cystitis (chronic) without hematuria: Secondary | ICD-10-CM | POA: Diagnosis not present

## 2017-05-17 DIAGNOSIS — E118 Type 2 diabetes mellitus with unspecified complications: Secondary | ICD-10-CM

## 2017-05-17 DIAGNOSIS — Z794 Long term (current) use of insulin: Secondary | ICD-10-CM

## 2017-05-17 LAB — COMPREHENSIVE METABOLIC PANEL
ALT: 32 U/L (ref 14–54)
AST: 24 U/L (ref 15–41)
Albumin: 3.7 g/dL (ref 3.5–5.0)
Alkaline Phosphatase: 62 U/L (ref 38–126)
Anion gap: 8 (ref 5–15)
BUN: 9 mg/dL (ref 6–20)
CHLORIDE: 106 mmol/L (ref 101–111)
CO2: 26 mmol/L (ref 22–32)
CREATININE: 0.81 mg/dL (ref 0.44–1.00)
Calcium: 9 mg/dL (ref 8.9–10.3)
GFR calc non Af Amer: >60 mL/min (ref >60)
Glucose, Bld: 131 mg/dL — ABNORMAL HIGH (ref 65–99)
POTASSIUM: 4.4 mmol/L (ref 3.5–5.1)
Sodium: 140 mmol/L (ref 135–145)
Total Bilirubin: 0.8 mg/dL (ref 0.3–1.2)
Total Protein: 6.1 g/dL — ABNORMAL LOW (ref 6.5–8.1)

## 2017-05-17 LAB — CBC WITH DIFFERENTIAL/PLATELET
BASOS ABS: 0 10*3/uL (ref 0.0–0.1)
Basophils Relative: 0 %
EOS ABS: 0.1 10*3/uL (ref 0.0–0.7)
EOS PCT: 2 %
HCT: 36 % (ref 36.0–46.0)
Hemoglobin: 11.2 g/dL — ABNORMAL LOW (ref 12.0–15.0)
LYMPHS ABS: 1 10*3/uL (ref 0.7–4.0)
Lymphocytes Relative: 20 %
MCH: 27.7 pg (ref 26.0–34.0)
MCHC: 31.1 g/dL (ref 30.0–36.0)
MCV: 88.9 fL (ref 78.0–100.0)
Monocytes Absolute: 0.3 10*3/uL (ref 0.1–1.0)
Monocytes Relative: 7 %
Neutro Abs: 3.5 10*3/uL (ref 1.7–7.7)
Neutrophils Relative %: 71 %
PLATELETS: 75 10*3/uL — AB (ref 150–400)
RBC: 4.05 MIL/uL (ref 3.87–5.11)
RDW: 14.6 % (ref 11.5–15.5)
WBC: 4.9 10*3/uL (ref 4.0–10.5)

## 2017-05-17 LAB — LACTATE DEHYDROGENASE: LDH: 144 U/L (ref 98–192)

## 2017-05-17 NOTE — Addendum Note (Signed)
Addended by: Truddie Crumble on: 05/17/2017 10:35 AM   Modules accepted: Orders

## 2017-05-17 NOTE — Progress Notes (Signed)
Hematology and Oncology Follow Up Visit  Yvonne Carrillo [id number] Jun 22, 1945 72 y.o. 05/17/2017 11:22 AM   Principle Diagnosis: Encounter Diagnoses  Name Primary?  . Type 2 diabetes mellitus with complication, without long-term current use of insulin ([place])   . Cirrhosis of liver without ascites, unspecified hepatic cirrhosis type ([place])   . Other pancytopenia ([place])   . Thrombocytopenia ([place]) Yes  . Hx of adenomatous colonic polyps   . 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 Although I could not palpate any organomegaly on my initial exam, ultrasound of the abdomen done 04/12/2014 demonstrated splenomegaly without any lymphadenopathy. She had no peripheral lymphadenopathy. 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 Chemistry profile was normal except for random glucose 124.  No evidence for a hemolytic process with normal LDH 251, , bilirubin 0.8, and reticulocyte count 0.6%. I checked CMV and EBV viral titers. She had evidence for exposure to both of these viruses with high IgG titers but negative IgM titers. CT scan of the abdomen and a chest radiograph were done on 05/03/2014. CT findings consistent with hepatic cirrhosis. No focal liver masses. Evidence for recanalization of the umbilical veins and mild esophageal varices consistent with portal hypertension, and mild to moderate splenomegaly spleen measuring 15-16 centimeters in length also consistent with portal venous hypertension. No intra-abdominal adenopathy. No focal defects in the spleen.  At that point we had an explanation for her pancytopenia and splenomegaly-hepatic cirrhosis. She has no known  exposures. She does not drink alcohol. She has never had a blood transfusion.  Hepatitis A, B, C, and HIV all negative. Ceruloplasmin 28: normal ANA: negative; alpha-1-antitrypsin 121:normal;  anti-smooth muscle and anti-mitochondrial antibodies: not detected  I referred her to a gastroenterologist to consider a liver biopsy. This was not done. Her platelet count and white count began to fall lower than I would expect for nonalcoholic hepatitis down to 34,000. I suspected that she had an immune component to her leukopenia. I started her on a course of oral prednisone. She had a prompt improvement in her counts with peak platelet count up to 80,000. Steroids were  tapered and she has been off steroids since the second week of August 2016. Both the platelet count and white count have fluctuated but overall platelets have remained over 58,000.  She tolerated steroids poorly particularly with respect to her blood sugars. She was concerned with hair loss which seemed to begin right around the time she stopped the steroids. She recently changed primary care physicians. Her new internist checked thyroid functions, cortisol levels, and iron levels all of which were normal.   Interim History: She has not had a formal visit with me since November 2016 although we have followed her blood counts and advised from a distance.  She lives in [place].  She has a good primary care physician.  He contacted me and we spoke by phone on May 06, 2017.  Last recorded platelet count from March 30, 2017 was 61,000.  She developed a urinary tract infection.  She was put on Cipro for 10 days between March 5 and March 15.  A routine platelet count done on April 8 was 22,000.  Repeat the next day 27,000.  She had no clinical bleeding  and denies any epistaxis, gum bleeding, hematuria, hematochezia, melena, or vaginal bleeding.  She did test positive for microscopic hematuria at time of the cystitis. It was not clear  whether the infection precipitated the fall in her platelet count from her baseline.  She has responded to prednisone in the past.  She is a type II diabetic.  She had a difficult time when she was on prednisone with extreme elevations of her glucose.  I recommended a trial of short duration high-dose dexamethasone 20 mg twice daily for 4 days.  Her internist started her on insulin with a sliding scale in anticipation of a rise in her sugar.  She tolerated the high-dose Decadron very well.  Her main side effect was insomnia.  She started some melatonin.  Her glucoses did go up but did not get much over 300.  The Dex was given between April 11 and April 15.  A platelet count on the 16th was 61,000 which is back to her baseline.  Platelet count today is 75,000.  No laboratory evidence for progressive liver dysfunction.  She showed me labs done by her internist recently and all of her liver functions were normal with an albumin of 4.4, bilirubin of 1.2.  Prothrombin time was 16 seconds.  Lab normal not recorded.  She has had no other interim health issues.  Medications: reviewed  Allergies:  Allergies  Allergen Reactions  . Epinephrine Other (See Comments)    Increase heart rate  . Penicillins Rash    Review of Systems: See interim history Remaining ROS negative:   Physical Exam: Blood pressure (!) 132/55, pulse 74, temperature 97.7 F (36.5 C), temperature source Oral, height 5\' 6"  (1.676 m), weight 178 lb (80.7 kg), SpO2 100 %. Wt Readings from Last 3 Encounters:  05/17/17 178 lb (80.7 kg)  12/01/16 185 lb (83.9 kg)  11/25/16 185 lb 3.2 oz (84 kg)     General appearance: Well-nourished Caucasian woman.  Anicteric. HENNT: Pharynx no erythema, exudate, mass, or ulcer. No thyromegaly or thyroid nodules Lymph nodes: No cervical, supraclavicular, or axillary lymphadenopathy Breasts:  Lungs: Clear to auscultation, resonant to percussion throughout Heart: Regular rhythm, no murmur, no  gallop, no rub, no click, no edema Abdomen: Soft, nontender, normal bowel sounds, no mass, no organomegaly Extremities: No edema, no calf tenderness Musculoskeletal: no joint deformities GU:  Vascular: Carotid pulses 2+, no bruits,  Neurologic: Alert, oriented, PERRLA,  hemorrhage or exudate, cranial nerves grossly normal,  reflexes 1+ symmetric,  Skin: No rash or ecchymosis  Lab Results: CBC W/Diff    Component Value Date/Time   WBC 4.6 11/25/2016 1432   RBC 4.36 11/25/2016 1432   HGB 12.3 11/25/2016 1432   HGB 11.2 09/18/2014 0952   HCT 37.7 11/25/2016 1432   HCT 34.7 09/18/2014 0952   PLT 78.0 (L) 11/25/2016 1432   PLT 58 (LL) 09/18/2014 0952   MCV 86.5 11/25/2016 1432   MCV 91 09/18/2014 0952   MCH 29.5 09/18/2014 0952   MCH 26.9 05/08/2014 1644   MCHC 32.7 11/25/2016 1432   RDW 13.9 11/25/2016 1432   RDW 15.2 09/18/2014 0952   LYMPHSABS 0.9 11/25/2016 1432   LYMPHSABS 0.8 09/18/2014 0952   MONOABS 0.5 11/25/2016 1432   EOSABS 0.1 11/25/2016 1432   EOSABS 0.1 09/18/2014 0952   BASOSABS 0.0 11/25/2016 1432   BASOSABS 0.0 09/18/2014 0952     Chemistry      Component Value Date/Time   NA 146 (H) 09/18/2014 3335  K 4.0 09/18/2014 0952   CL 107 09/18/2014 0952   CO2 22 09/18/2014 0952   BUN 9 09/18/2014 0952   CREATININE 0.66 09/18/2014 0952      Component Value Date/Time   CALCIUM 9.1 09/18/2014 0952   ALKPHOS 75 09/18/2014 0952   AST 19 09/18/2014 0952   ALT 16 09/18/2014 0952   BILITOT 0.7 09/18/2014 0952    Liver functions all normal today May 17, 2017: Albumin 3.7, SGOT 24, SGPT 32, bilirubin 0.8, alkaline phosphatase 62. Platelet count today: 75,000 following 4-day course of dexamethasone given April 11 - May 10, 2017.   Radiological Studies: No results found.  Impression:  1.  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  Infectious and autoimmune studies all negative.  Ultrasound with the fibroelastography technique done in December 2016 showed a nodular liver with heterogeneous echotexture, no focal lesions, primarily F to grade fibrosis with some F3.  Fibrosis risk moderate. Stable liver functions at this time.  I did not schedule a follow-up study at this time but it would be reasonable to check every few years.  She has a stable exam and normal liver functions at this time.  2.  Pancytopenia with predominant thrombocytopenia.  Baseline platelet count around 60,000 with periodic falls as low as 22,000 recently which may have been related to infection.  Some evidence that her thrombocytopenia may be partially immune mediated with response to steroids in the past.  Rise to a peak platelet count of 75,000 from nadir of 22,000 at this time.   No signs of a myelodysplastic syndrome or any other bone marrow pathology on study done February 2016. Plan: Continue to follow platelet counts over time.  I gave her some information about a new thrombopoietin receptor agonist Avatrombopag (D recently approved Doptelet) for rapid rise in platelet count which can be given for a 5-day course 1-2 weeks prior to a planned surgical intervention to raise the platelet count if needed for platelet counts less than 40,000.  3.  Type 2 diabetes. Transient use of insulin recently to cover steroid effect.  4.  Essential hypertension Well-controlled on current medications  5.  History of adenomatous polyps of the colon.  6.  Hyperlipidemia  7.  Chronic interstitial cystitis  8.  Recent urinary tract infection.  Treated.  CC: Patient Care Team: Tobe Sos, MD as PCP - General (Internal Medicine) Annia Belt, MD as Consulting Physician (Oncology) Carol Ada, MD as Consulting Physician (Gastroenterology)   Murriel Hopper, MD, [place]  Hematology-Oncology/Internal  Medicine     4/22/201911:22 AM

## 2017-05-17 NOTE — Patient Instructions (Signed)
Return visit 6 months Lab day of visit 

## 2017-06-24 ENCOUNTER — Telehealth: Payer: Self-pay | Admitting: *Deleted

## 2017-06-24 NOTE — Telephone Encounter (Signed)
Received fax from [facility] results given to Dr. Beryle Beams Plt count 59,000. Pt called per Dr Darnell Level - repeat labs in a month. She will need an order fax to Dr Roosvelt Harps office. I will call their office tomorrow for correct #.

## 2017-06-25 NOTE — Telephone Encounter (Signed)
Rx done; please fax Thanks DrG

## 2017-06-25 NOTE — Telephone Encounter (Signed)
Rx faxed to Dr Jacquelynn Cree office; pt called / informed.

## 2017-06-25 NOTE — Telephone Encounter (Signed)
Called Dr Carlean Jews office - fax # is [phone].

## 2017-08-03 ENCOUNTER — Telehealth: Payer: Self-pay | Admitting: Internal Medicine

## 2017-08-03 ENCOUNTER — Telehealth: Payer: Self-pay | Admitting: Oncology

## 2017-08-03 NOTE — Telephone Encounter (Signed)
Pt is calling about lab results from 06/2017, pt contact # [phone]

## 2017-08-03 NOTE — Telephone Encounter (Signed)
I have not seen any lab results from Dr Roosvelt Harps office - called pt; stated she will call their office, fax # given.

## 2017-08-04 NOTE — Telephone Encounter (Signed)
OK to call pt w results: platelets 52,000; insignificant change from 6/19 value of 59,000. Continue to monitor Q 2 months.

## 2017-08-04 NOTE — Telephone Encounter (Signed)
Lab result called to pt - informed Plt count is 52,000 (done 07/23/17), insignificant change from last time of 59,000 per Dr Beryle Beams. And repeat in 2 months. Pt stated she will need a new rx fax to Dr Carlean Jews office [phone]); stated their office was unable to read last rx clearly about how often to draw labs. Results to be scanned into chart.

## 2017-08-04 NOTE — Telephone Encounter (Signed)
Received lab results - given to Dr Beryle Beams.

## 2017-08-06 NOTE — Telephone Encounter (Signed)
New Rx faxed 08/06/17

## 2017-08-26 ENCOUNTER — Telehealth: Payer: Self-pay | Admitting: *Deleted

## 2017-08-26 NOTE — Telephone Encounter (Signed)
Received fax from [facility] drawn 08/24/17 - seen by Dr Beryle Beams , stated "to call pt with platelet count @ 73,000 and liver test are normal". Pt was called and informed per Dr Darnell Level.

## 2017-11-05 ENCOUNTER — Telehealth: Payer: Self-pay | Admitting: *Deleted

## 2017-11-05 NOTE — Telephone Encounter (Signed)
OK thanks. Please call her w results

## 2017-11-05 NOTE — Telephone Encounter (Signed)
Pt called / informed of results. Stated she will see [place] in November.

## 2017-11-05 NOTE — Telephone Encounter (Signed)
Received fax from pt - lab results (CBC/diff) drawn on 10/8. Plt 62 Hgb 10.9 WBC 3.5. I will place result in your mailbox. Thanks

## 2017-11-16 ENCOUNTER — Ambulatory Visit: Payer: Medicare Other | Admitting: Oncology

## 2017-12-06 ENCOUNTER — Telehealth: Payer: Self-pay | Admitting: *Deleted

## 2017-12-06 ENCOUNTER — Ambulatory Visit: Payer: Medicare Other | Admitting: Oncology

## 2017-12-06 ENCOUNTER — Encounter: Payer: Self-pay | Admitting: Oncology

## 2017-12-06 ENCOUNTER — Other Ambulatory Visit: Payer: Self-pay

## 2017-12-06 VITALS — BP 151/60 | HR 78 | Temp 97.9°F | Ht 66.0 in | Wt 180.1 lb

## 2017-12-06 DIAGNOSIS — K766 Portal hypertension: Secondary | ICD-10-CM | POA: Diagnosis not present

## 2017-12-06 DIAGNOSIS — D61818 Other pancytopenia: Secondary | ICD-10-CM

## 2017-12-06 DIAGNOSIS — Z888 Allergy status to other drugs, medicaments and biological substances status: Secondary | ICD-10-CM

## 2017-12-06 DIAGNOSIS — R161 Splenomegaly, not elsewhere classified: Secondary | ICD-10-CM

## 2017-12-06 DIAGNOSIS — D126 Benign neoplasm of colon, unspecified: Secondary | ICD-10-CM

## 2017-12-06 DIAGNOSIS — K7469 Other cirrhosis of liver: Secondary | ICD-10-CM

## 2017-12-06 DIAGNOSIS — K7460 Unspecified cirrhosis of liver: Secondary | ICD-10-CM | POA: Diagnosis not present

## 2017-12-06 DIAGNOSIS — D696 Thrombocytopenia, unspecified: Secondary | ICD-10-CM

## 2017-12-06 DIAGNOSIS — Z880 Allergy status to penicillin: Secondary | ICD-10-CM

## 2017-12-06 DIAGNOSIS — I10 Essential (primary) hypertension: Secondary | ICD-10-CM

## 2017-12-06 DIAGNOSIS — Z7984 Long term (current) use of oral hypoglycemic drugs: Secondary | ICD-10-CM

## 2017-12-06 DIAGNOSIS — E119 Type 2 diabetes mellitus without complications: Secondary | ICD-10-CM

## 2017-12-06 DIAGNOSIS — I2510 Atherosclerotic heart disease of native coronary artery without angina pectoris: Secondary | ICD-10-CM

## 2017-12-06 LAB — CBC WITH DIFFERENTIAL/PLATELET
ABS IMMATURE GRANULOCYTES: 0 10*3/uL (ref 0.00–0.07)
Basophils Absolute: 0 10*3/uL (ref 0.0–0.1)
Basophils Relative: 1 %
EOS ABS: 0.1 10*3/uL (ref 0.0–0.5)
Eosinophils Relative: 2 %
HEMATOCRIT: 38.3 % (ref 36.0–46.0)
Hemoglobin: 11.2 g/dL — ABNORMAL LOW (ref 12.0–15.0)
IMMATURE GRANULOCYTES: 0 %
LYMPHS PCT: 19 %
Lymphs Abs: 0.8 10*3/uL (ref 0.7–4.0)
MCH: 26.5 pg (ref 26.0–34.0)
MCHC: 29.2 g/dL — ABNORMAL LOW (ref 30.0–36.0)
MCV: 90.5 fL (ref 80.0–100.0)
MONO ABS: 0.4 10*3/uL (ref 0.1–1.0)
MONOS PCT: 11 %
NEUTROS ABS: 2.8 10*3/uL (ref 1.7–7.7)
NEUTROS PCT: 67 %
NRBC: 0 % (ref 0.0–0.2)
Platelets: 54 10*3/uL — ABNORMAL LOW (ref 150–400)
RBC: 4.23 MIL/uL (ref 3.87–5.11)
RDW: 13.4 % (ref 11.5–15.5)
WBC: 4.1 10*3/uL (ref 4.0–10.5)

## 2017-12-06 LAB — COMPREHENSIVE METABOLIC PANEL
ALT: 24 U/L (ref 0–44)
AST: 28 U/L (ref 15–41)
Albumin: 4.4 g/dL (ref 3.5–5.0)
Alkaline Phosphatase: 85 U/L (ref 38–126)
Anion gap: 7 (ref 5–15)
BILIRUBIN TOTAL: 0.9 mg/dL (ref 0.3–1.2)
BUN: 9 mg/dL (ref 8–23)
CO2: 27 mmol/L (ref 22–32)
CREATININE: 0.81 mg/dL (ref 0.44–1.00)
Calcium: 10 mg/dL (ref 8.9–10.3)
Chloride: 106 mmol/L (ref 98–111)
GFR calc Af Amer: >60 mL/min (ref >60)
Glucose, Bld: 92 mg/dL (ref 70–99)
Potassium: 4 mmol/L (ref 3.5–5.1)
Sodium: 140 mmol/L (ref 135–145)
TOTAL PROTEIN: 6.9 g/dL (ref 6.5–8.1)

## 2017-12-06 NOTE — Patient Instructions (Signed)
To lab today Return as needed before 03/2018  We will make a referral to a Hematologist at [facility] after the new year for your follow up Hematology care

## 2017-12-06 NOTE — Telephone Encounter (Signed)
-----   Message from Annia Belt, MD sent at 12/06/2017  1:41 PM EST ----- Call pt: platelets 54,000 today; WBC 4,100; Hb stable @11 .2

## 2017-12-06 NOTE — Telephone Encounter (Signed)
Pt called / informed "platelets 54,000 today; WBC 4,100; Hb stable @11 .2 " per Dr Beryle Beams.  Stated thank-you for calling.

## 2017-12-07 NOTE — Progress Notes (Signed)
Hematology and Oncology Follow Up Visit  Yvonne Carrillo [id number] 11-23-1945 72 y.o. 12/07/2017 6:18 PM   Principle Diagnosis: Encounter Diagnoses  Name Primary?  . Cirrhosis of liver without ascites, unspecified hepatic cirrhosis type ([place])   . Thrombocytopenia ([place]) Yes  . 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 She had no peripheral lymphadenopathy. 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 Chemistry profile was normal except for random glucose 124.  No evidence for a hemolytic process with normal LDH 251, , bilirubin 0.8, and reticulocyte count 0.6%. I checked CMV and EBV viral titers.She had evidence for exposure to both of these viruses with high IgG titers butnegative IgM titers. CT scan of the abdomen and a chest radiograph were done on 05/03/2014.CTfindings consistent with hepaticcirrhosis. No focal liver masses.Evidence for recanalization of the umbilical veins and mild esophageal varicesconsistent with portal hypertension, and mild to moderate splenomegalyspleen measuring 15-16 centimetersin length also consistent with portal venous hypertension. No intra-abdominal adenopathy. No focal defects in the spleen.  At that point we had an explanation for her pancytopenia and splenomegaly-hepatic cirrhosis. She has no known exposures. She does not drink alcohol. She has never had a blood transfusion.  Hepatitis A, B, C, and HIV all negative. Ceruloplasmin 28: normal ANA: negative; alpha-1-antitrypsin 121:normal;   anti-smooth muscle and anti-mitochondrial antibodies: not detected  I referred her to a gastroenterologist to consider a liver biopsy. This was not done. Her platelet count and white count began to fall lower than I would expect for nonalcoholic hepatitis down to 34,000. I suspected that she had an immune component to her leukopenia. I started her on a course of oral prednisone. She had a prompt improvement in her counts with peak platelet count up to 80,000.  She tolerated steroids poorly particularly with respect to her blood sugars. Overall her platelet count stabilized after the initial course of steroids and has ranged in the 50-65000 range  except for an isolated fall down to 22,000 in early April 2019.  She was treated with a short course of high-dose dexamethasone 20 mg twice daily x4 days.  She tolerated this much better than prednisone.  Peak platelet count up to 75,000 by April 22.  Since then counts have fluctuated but generally over 50,000.  Count today 54,000 (12/07/2017).  Interim History: She has had no clinical bleeding and denies epistaxis, gum bleeding, hematochezia, or hematuria.  She has had no other interim medical problems.  She has noted palmar erythema and we discussed that this was a unexplained phenomenon associated with liver disease.  Her liver functions have remained remarkably normal despite the radiographic changes. Husband accompanies her today as usual.  Medications: reviewed  Allergies:  Allergies  Allergen Reactions  . Epinephrine Other (See Comments)    Increase heart rate  . Penicillins Rash    Review of Systems: See interim history Remaining ROS negative:   Physical Exam: Blood pressure (!) 151/60, pulse 78, temperature 97.9 F (36.6 C), temperature source Oral, height 5\' 6"  (1.676 m), weight 180 lb 1.6 oz (81.7 kg), SpO2 100 %. Wt Readings from Last 3 Encounters:  12/06/17 180 lb 1.6 oz (  81.7 kg)  05/17/17 178 lb (80.7 kg)  12/01/16 185 lb  (83.9 kg)     General appearance: Well-nourished Caucasian woman HENNT: Pharynx no erythema, exudate, mass, or ulcer. No thyromegaly or thyroid nodules Lymph nodes: No cervical, supraclavicular, or axillary lymphadenopathy Breasts:  Lungs: Clear to auscultation, resonant to percussion throughout Heart: Regular rhythm, no murmur, no gallop, no rub, no click, no edema Abdomen: Soft, nontender, normal bowel sounds, no mass, no obvious organomegaly despite splenomegaly on ultrasound Extremities: No edema, no calf tenderness Musculoskeletal: no joint deformities GU:  Vascular: Carotid pulses 2+, no bruits,  Neurologic: Alert, oriented, PERRLA, , cranial nerves grossly normal, motor strength 5 over 5, reflexes 1+ symmetric, upper body coordination normal, gait normal, Skin: No rash or ecchymosis  Lab Results: CBC W/Diff    Component Value Date/Time   WBC 4.1 12/06/2017 1010   RBC 4.23 12/06/2017 1010   HGB 11.2 (L) 12/06/2017 1010   HGB 11.2 09/18/2014 0952   HCT 38.3 12/06/2017 1010   HCT 34.7 09/18/2014 0952   PLT 54 (L) 12/06/2017 1010   PLT 58 (LL) 09/18/2014 0952   MCV 90.5 12/06/2017 1010   MCV 91 09/18/2014 0952   MCH 26.5 12/06/2017 1010   MCHC 29.2 (L) 12/06/2017 1010   RDW 13.4 12/06/2017 1010   RDW 15.2 09/18/2014 0952   LYMPHSABS 0.8 12/06/2017 1010   LYMPHSABS 0.8 09/18/2014 0952   MONOABS 0.4 12/06/2017 1010   EOSABS 0.1 12/06/2017 1010   EOSABS 0.1 09/18/2014 0952   BASOSABS 0.0 12/06/2017 1010   BASOSABS 0.0 09/18/2014 0952     Chemistry      Component Value Date/Time   NA 140 12/06/2017 1010   NA 146 (H) 09/18/2014 0952   K 4.0 12/06/2017 1010   CL 106 12/06/2017 1010   CO2 27 12/06/2017 1010   BUN 9 12/06/2017 1010   BUN 9 09/18/2014 0952   CREATININE 0.81 12/06/2017 1010      Component Value Date/Time   CALCIUM 10.0 12/06/2017 1010   ALKPHOS 85 12/06/2017 1010   AST 28 12/06/2017 1010   ALT 24 12/06/2017 1010   BILITOT 0.9 12/06/2017 1010    BILITOT 0.7 09/18/2014 0626       Radiological Studies: No results found.  Impression: 1.  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  2.  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  3.  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  4.  Adenomatous polyps of the colon  5.  Type 2 diabetes on oral agents.  6.  Essential hypertension  I will transition her hematology care to 1 of the hematologist at the [facility] lung cancer center.  We discussed alternative centers,  primarily the facility at [facility] now managed through [facility].  She would prefer to come to [facility] for her hematology care. She is advised to establish with a hepatologist.   CC: Patient Care Team: Tobe Sos, MD as PCP - General (Internal Medicine) Annia Belt, MD as Consulting Physician (Oncology) Altheimer, Legrand Como, MD as Consulting Physician (Endocrinology) Carol Ada, MD as Consulting Physician (Gastroenterology)   Murriel Hopper, MD, [place]  Hematology-Oncology/Internal Medicine     11/12/20196:18 PM

## 2017-12-13 ENCOUNTER — Ambulatory Visit: Payer: Medicare Other | Admitting: Oncology

## 2017-12-21 ENCOUNTER — Other Ambulatory Visit: Payer: Self-pay | Admitting: Oncology

## 2017-12-21 DIAGNOSIS — D696 Thrombocytopenia, unspecified: Secondary | ICD-10-CM

## 2017-12-21 DIAGNOSIS — K7460 Unspecified cirrhosis of liver: Secondary | ICD-10-CM

## 2017-12-21 DIAGNOSIS — D61818 Other pancytopenia: Secondary | ICD-10-CM

## 2018-02-04 ENCOUNTER — Telehealth: Payer: Self-pay | Admitting: Oncology

## 2018-02-04 NOTE — Telephone Encounter (Signed)
Called pt - no answer; left message referral had been ordered per Dr Darnell Level and we are on the same computer system.

## 2018-02-04 NOTE — Telephone Encounter (Signed)
I believe she told me she wanted to stay in [place]. I made referral to Dr Burr Medico at [facility] cancer center

## 2018-02-04 NOTE — Telephone Encounter (Signed)
Glenda pls call [phone]; pt is wants to know if her records had been transfer to a diff office, if so what office

## 2018-03-24 ENCOUNTER — Telehealth: Payer: Self-pay | Admitting: Nurse Practitioner

## 2018-03-24 ENCOUNTER — Encounter: Payer: Self-pay | Admitting: Nurse Practitioner

## 2018-03-24 NOTE — Telephone Encounter (Signed)
Received a new hem referral from Dr. Beryle Beams for the pt to see Regan Rakers Burton/Dr. Burr Medico on 4/30 at 145pm w/labs at 115pm. Letter mailed to the pt.

## 2018-03-28 ENCOUNTER — Encounter: Payer: Self-pay | Admitting: *Deleted

## 2018-05-18 ENCOUNTER — Telehealth: Payer: Self-pay

## 2018-05-18 NOTE — Telephone Encounter (Signed)
Returned TC to pt regarding her appointments and appointments times for April 30th. I let her know that she is scheduled to have labs at 1:15p and see Lacie at 1:45p. Pt verbalized understanding. No further problems or concerns at this time.

## 2018-05-25 ENCOUNTER — Other Ambulatory Visit: Payer: Self-pay | Admitting: Nurse Practitioner

## 2018-05-25 DIAGNOSIS — D61818 Other pancytopenia: Secondary | ICD-10-CM

## 2018-05-26 ENCOUNTER — Other Ambulatory Visit: Payer: Self-pay

## 2018-05-26 ENCOUNTER — Inpatient Hospital Stay: Payer: Medicare Other | Attending: Nurse Practitioner | Admitting: Nurse Practitioner

## 2018-05-26 ENCOUNTER — Encounter: Payer: Self-pay | Admitting: Nurse Practitioner

## 2018-05-26 ENCOUNTER — Inpatient Hospital Stay: Payer: Medicare Other

## 2018-05-26 VITALS — BP 159/77 | HR 100 | Temp 99.2°F | Resp 18 | Ht 66.0 in | Wt 170.9 lb

## 2018-05-26 DIAGNOSIS — D61818 Other pancytopenia: Secondary | ICD-10-CM

## 2018-05-26 DIAGNOSIS — K7469 Other cirrhosis of liver: Secondary | ICD-10-CM

## 2018-05-26 DIAGNOSIS — E119 Type 2 diabetes mellitus without complications: Secondary | ICD-10-CM

## 2018-05-26 DIAGNOSIS — R300 Dysuria: Secondary | ICD-10-CM

## 2018-05-26 DIAGNOSIS — Z8619 Personal history of other infectious and parasitic diseases: Secondary | ICD-10-CM

## 2018-05-26 DIAGNOSIS — R350 Frequency of micturition: Secondary | ICD-10-CM | POA: Diagnosis not present

## 2018-05-26 DIAGNOSIS — I10 Essential (primary) hypertension: Secondary | ICD-10-CM | POA: Diagnosis not present

## 2018-05-26 DIAGNOSIS — D696 Thrombocytopenia, unspecified: Secondary | ICD-10-CM | POA: Diagnosis not present

## 2018-05-26 LAB — CMP (CANCER CENTER ONLY)
ALT: 17 U/L (ref 0–44)
AST: 25 U/L (ref 15–41)
Albumin: 4.4 g/dL (ref 3.5–5.0)
Alkaline Phosphatase: 91 U/L (ref 38–126)
Anion gap: 12 (ref 5–15)
BUN: 10 mg/dL (ref 8–23)
CO2: 25 mmol/L (ref 22–32)
Calcium: 10.3 mg/dL (ref 8.9–10.3)
Chloride: 106 mmol/L (ref 98–111)
Creatinine: 0.83 mg/dL (ref 0.44–1.00)
GFR, Est AFR Am: >60 mL/min (ref >60)
GFR, Estimated: >60 mL/min (ref >60)
Glucose, Bld: 128 mg/dL — ABNORMAL HIGH (ref 70–99)
Potassium: 4.1 mmol/L (ref 3.5–5.1)
Sodium: 143 mmol/L (ref 135–145)
Total Bilirubin: 1 mg/dL (ref 0.3–1.2)
Total Protein: 7.2 g/dL (ref 6.5–8.1)

## 2018-05-26 LAB — CBC WITH DIFFERENTIAL (CANCER CENTER ONLY)
Abs Immature Granulocytes: 0.01 10*3/uL (ref 0.00–0.07)
Basophils Absolute: 0 10*3/uL (ref 0.0–0.1)
Basophils Relative: 0 %
Eosinophils Absolute: 0.1 10*3/uL (ref 0.0–0.5)
Eosinophils Relative: 1 %
HCT: 36.7 % (ref 36.0–46.0)
Hemoglobin: 11.5 g/dL — ABNORMAL LOW (ref 12.0–15.0)
Immature Granulocytes: 0 %
Lymphocytes Relative: 14 %
Lymphs Abs: 0.6 10*3/uL — ABNORMAL LOW (ref 0.7–4.0)
MCH: 27.3 pg (ref 26.0–34.0)
MCHC: 31.3 g/dL (ref 30.0–36.0)
MCV: 87 fL (ref 80.0–100.0)
Monocytes Absolute: 0.4 10*3/uL (ref 0.1–1.0)
Monocytes Relative: 9 %
Neutro Abs: 3.6 10*3/uL (ref 1.7–7.7)
Neutrophils Relative %: 76 %
Platelet Count: 61 10*3/uL — ABNORMAL LOW (ref 150–400)
RBC: 4.22 MIL/uL (ref 3.87–5.11)
RDW: 13.7 % (ref 11.5–15.5)
WBC Count: 4.7 10*3/uL (ref 4.0–10.5)
nRBC: 0 % (ref 0.0–0.2)

## 2018-05-26 LAB — URINALYSIS, COMPLETE (UACMP) WITH MICROSCOPIC
Bilirubin Urine: NEGATIVE
Glucose, UA: NEGATIVE mg/dL
Ketones, ur: NEGATIVE mg/dL
Nitrite: NEGATIVE
Protein, ur: NEGATIVE mg/dL
Specific Gravity, Urine: 1.005 (ref 1.005–1.030)
WBC, UA: >50 WBC/hpf — ABNORMAL HIGH (ref 0–5)
pH: 6 (ref 5.0–8.0)

## 2018-05-26 MED ORDER — CIPROFLOXACIN HCL 250 MG PO TABS: 250.0000 mg | ORAL_TABLET | Freq: Two times a day (BID) | ORAL | 0 refills | Status: DC

## 2018-05-26 NOTE — Progress Notes (Addendum)
Yvonne Carrillo  [phone] Fax:([phone] [place] Note   Patient Care Team: Tobe Sos, MD as PCP - General (Internal Medicine) Annia Belt, MD as Consulting Physician (Oncology) Altheimer, Legrand Como, MD as Consulting Physician (Endocrinology) Carol Ada, MD as Consulting Physician (Gastroenterology) Irene Shipper, MD as Consulting Physician (Gastroenterology) Truitt Merle, MD as Consulting Physician (Hematology) Alla Feeling, NP as Nurse Practitioner (Nurse Practitioner) 05/26/2018  CHIEF COMPLAINTS/PURPOSE OF CONSULTATION:  Previously Dr. Beryle Beams patient; pancytopenia   HISTORY OF PRESENTING ILLNESS:  Yvonne Carrillo 73 y.o. female referred by Dr. Beryle Beams. She was previously referred in 02/2014 for pancytopenia showing a mild anemia and leukopenia with a predominance of thrombocytopenia. plt as low as 34K in 04/2014. Her work up showed slightly hypercellular bone marrow for age with trilineage hematopoiesis. Cytogenics were normal. An abdominal US in 03/2014 showed splenomegaly but was otherwise normal. Chest xray on 04/2014 was negative. CT AP on 05/03/14 showed hepatic cirrhosis and mild esophageal varices, findings consistent with portal venous hypertension. Also showed mild-moderate splenomegaly. No ascites or liver mass. Other work up including immune profile showed negative ANA, normal ceruloplasmin and a-1 antitrypsin; anti-smooth muscle and anti-mitochondrial antibodies were not detected. HIV and Hepatitis panel were negative. She had high IgG titers but negative IgM titers for CMV and EBV. No exposures, blood transfusion, alcohol use, or drug use. The cause for her liver cirrhosis remains unknown. Her thrombocytopenia likely has immune component as she responded to steroids in the past.   PMH is significant for well controlled DM, HTN, cystocele, rectocele, and chronic interstitial cystitis. She is followed by endocrine, gyn,  and PCP. She has been Dr. Henrene Pastor for colonoscopy. Socially she is married, lives with her spouse in [place], [place]. She is independent of ADLs. She has 3 adult children who are healthy. Denies family history of blood disorder or cancer.   Today she reports that she's recovering from multiple infections. Had sinusitis, bronchitis, ear infections, vaginal yeast infection over the last 2 months. She has urinary burning and frequency for last 5 days. She has chronic interstitial cystitis and these symptoms mimic UTI. She denies bleeding, fever, weight loss, fatigue, n/v/c/d, cough, chest pain, or dyspnea.    MEDICAL HISTORY:  Past Medical History:  Diagnosis Date  . Anemia    not current  . Cancer (HCC)    basal and squamous cell carcinoma  . Chronic interstitial cystitis 03/05/2014  . Diabetes mellitus without complication ([place])   . Hepatic cirrhosis ([place]) 05/08/2014  . Hyperlipidemia   . Hypertension   . Osteopenia   . Thrombocytopenia ([place]) 03/05/2014   platelet count runs low and very low at times    SURGICAL HISTORY: Past Surgical History:  Procedure Laterality Date  . BRAIN MENINGIOMA EXCISION    . COLONOSCOPY    . LAMINECTOMY    . PARTIAL HYSTERECTOMY     1993  . POLYPECTOMY     [name] in [place], [place] HISTORY: Social History   Socioeconomic History  . Marital status: Married    Spouse name: Not on file  . Number of children: Not on file  . Years of education: Not on file  . Highest education level: Not on file  Occupational History  . Not on file  Social Needs  . Financial resource strain: Not on file  . Food insecurity:    Worry: Not on file    Inability: Not on file  . Transportation needs:  Medical: Not on file    Non-medical: Not on file  Tobacco Use  . Smoking status: Never Smoker  . Smokeless tobacco: Never Used  Substance and Sexual Activity  . Alcohol use: No    Alcohol/week: 0.0 standard drinks  . Drug use: No  . Sexual activity: Not on  file  Lifestyle  . Physical activity:    Days per week: Not on file    Minutes per session: Not on file  . Stress: Not on file  Relationships  . Social connections:    Talks on phone: Not on file    Gets together: Not on file    Attends religious service: Not on file    Active member of club or organization: Not on file    Attends meetings of clubs or organizations: Not on file    Relationship status: Not on file  . Intimate partner violence:    Fear of current or ex partner: Not on file    Emotionally abused: Not on file    Physically abused: Not on file    Forced sexual activity: Not on file  Other Topics Concern  . Not on file  Social History Narrative  . Not on file    FAMILY HISTORY: Family History  Problem Relation Age of Onset  . COPD Mother   . Heart disease Father   . Colon cancer Neg Hx   . Colon polyps Neg Hx   . Esophageal cancer Neg Hx   . Rectal cancer Neg Hx   . Stomach cancer Neg Hx     ALLERGIES:  is allergic to epinephrine and penicillins.  MEDICATIONS:  Current Outpatient Medications  Medication Sig Dispense Refill  . azelastine (ASTELIN) 0.1 % nasal spray Place 2 sprays into both nostrils 2 (two) times daily. Use in each nostril as directed    . calcium citrate-vitamin D (CITRACAL+D) 315-200 MG-UNIT tablet Take 1 tablet by mouth 2 (two) times daily.    . cholecalciferol (VITAMIN D) 400 UNITS TABS tablet Take 2,000 Units by mouth daily.    . irbesartan (AVAPRO) 150 MG tablet Take 150 mg by mouth daily.    . simvastatin (ZOCOR) 40 MG tablet Take 20 mg by mouth at bedtime.    . sitaGLIPtin-metformin (JANUMET) 50-1000 MG per tablet Take 1 tablet by mouth daily.     . ciprofloxacin (CIPRO) 250 MG tablet Take 1 tablet (250 mg total) by mouth 2 (two) times daily. 6 tablet 0  . glimepiride (AMARYL) 1 MG tablet Take 0.5 mg by mouth daily with breakfast.      No current facility-administered medications for this visit.     REVIEW OF SYSTEMS:    Constitutional: Denies fatigue, fevers, chills, weight loss, or abnormal night sweats Ears, nose, mouth, throat, and face: Denies mucositis, sore throat, epistaxis, or mucosal bleeding (+) recent ear infection, sinusitis, and bronchitis  Respiratory: Denies cough, dyspnea or wheezes Cardiovascular: Denies palpitation, chest discomfort or lower extremity swelling Gastrointestinal:  Denies nausea, vomiting, constipation, diarrhea, rectal bleeding, heartburn or change in bowel habits GU: (+) cystocele (+) burning (+) frequency  Skin: Denies abnormal skin rashes Lymphatics: Denies new lymphadenopathy or easy bruising Behavioral/Psych: Mood is stable, no new changes  All other systems were reviewed with the patient and are negative.  PHYSICAL EXAMINATION: ECOG PERFORMANCE STATUS: 0 - Asymptomatic  Vitals:   05/26/18 1402  BP: (!) 159/77  Pulse: 100  Resp: 18  Temp: 99.2 F (37.3 C)  SpO2: 100%   Filed  Weights   05/26/18 1402  Weight: 170 lb 14.4 oz (77.5 kg)    GENERAL:alert, no distress and comfortable SKIN: skin color normal. Palmar erythema  EYES: sclera clear LYMPH:  no palpable cervical, supraclavicular, or axillary lymphadenopathy LUNGS: clear to auscultation with normal breathing effort HEART: regular rate & rhythm, no lower extremity edema ABDOMEN:abdomen soft, non-tender and normal bowel sounds. No hepatosplenomegaly  Musculoskeletal:no cyanosis of digits and no clubbing  PSYCH: alert & oriented x 3 with fluent speech NEURO: no focal motor/sensory deficits  LABORATORY DATA:  I have reviewed the data as listed CBC Latest Ref Rng & Units 05/26/2018 12/06/2017 05/17/2017  WBC 4.0 - 10.5 K/uL 4.7 4.1 4.9  Hemoglobin 12.0 - 15.0 g/dL 11.5(L) 11.2(L) 11.2(L)  Hematocrit 36.0 - 46.0 % 36.7 38.3 36.0  Platelets 150 - 400 K/uL 61(L) 54(L) 75(L)   CMP Latest Ref Rng & Units 05/26/2018 12/06/2017 05/17/2017  Glucose 70 - 99 mg/dL 128(H) 92 131(H)  BUN 8 - 23 mg/dL 10 9 9    Creatinine 0.44 - 1.00 mg/dL 0.83 0.81 0.81  Sodium 135 - 145 mmol/L 143 140 140  Potassium 3.5 - 5.1 mmol/L 4.1 4.0 4.4  Chloride 98 - 111 mmol/L 106 106 106  CO2 22 - 32 mmol/L 25 27 26   Calcium 8.9 - 10.3 mg/dL 10.3 10.0 9.0  Total Protein 6.5 - 8.1 g/dL 7.2 6.9 6.1(L)  Total Bilirubin 0.3 - 1.2 mg/dL 1.0 0.9 0.8  Alkaline Phos 38 - 126 U/L 91 85 62  AST 15 - 41 U/L 25 28 24   ALT 0 - 44 U/L 17 24 32   Bone marrow biopsy and aspirate: 03/13/2014  Diagnosis Bone Marrow, Aspirate,Biopsy, and Clot, right iliac - SLIGHTLY HYPERCELLULAR BONE MARROW FOR AGE WITH TRILINEAGE HEMATOPOIESIS. - SEE COMMENT. PERIPHERAL BLOOD: - PANCYTOPENIA. Diagnosis     RADIOGRAPHIC STUDIES: I have personally reviewed the radiological images as listed and agreed with the findings in the report. No results found.  ASSESSMENT & PLAN: 73 y/o female with DM, HTN, chronic interstitial cystitis, and pancytopenia   1. 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 Bone marrow biopsy was normal. Work up revealed cirrhosis as the cause of her splenomegaly and pancytopenia.  -there appears to me immune component of her thrombocytopenia, as it responded to steroids in the past. She is intolerant to prednisone. Consider dexamethasone for ITP flare if plt <30K -If platelets fall between 10-20K, or she has active bleeding we would consider plt transfusion -Dr. Burr Medico discussed possible administration of growth factor that may be given to stimulate platelet production if platelets fall between 30-50K before surgery or procedure.  -Today her platelets are 61K and have remained stable lately, she does not need treatment now -We discussed infection is a common cause for platelet count to drop; she is recovering from several infections  -She will get CBC done in 2 and 4 months at  [facility] in [place], we will notify her when we receive the report  -she will return for lab and f/u in 6 months   2. Liver cirrhosis, cryptogenic  -she underwent very thorough work up by Dr. Beryle Beams which was essentially unremarkable, the cause for her liver cirrhosis remains unknown. -CT in 2016 showed esophageal varices and findings consistent with portal venous hypertension -She has not had EGD for esophageal varices, will send message to Dr. Henrene Pastor about this -We discussed cirrhosis is  the main risk factor for hepatocellular carcinoma -last [place] in 2016 showed no liver lesions; AFP was normal -her liver functions remains normal, will monitor -plan to check AFP and abd [place] in 6 months before next f/u for liver cancer screening  -she is up to date on mammogram and colonoscopy   3. Multiple infections -from February through April, she has had recurrent infections including sinusitis, otitis media with effusions, and bronchitis. She has required multiple courses of antibiotics  -will check IgG today, consider IVIG if low  -she most recently developed vaginal yeast infection and now reports dysuria and frequency -she also has known cystocele and chronic interstitial cystitis, which causes symptoms similar to UTI -UA today is positive, culture is pending. Will begin treatment with Cipro po which has worked for her in the past.  -f/u with GYN for cystocele   4. DM, HTN -we reviewed DM is associated with recurrent infections.  -her sugar has been controlled lately, A1c 6-7; BG 128 today -on Janumet -she will continue f/u with endocrine and PCP  PLAN: -Medical record reviewed with patient in detail -CBC is stable, CMP normal  -Repeat CBC in 2 and 4 months at [facility] in [place], orders faxed  -Lab (CBC, AFP), ABD [place], and f/u with me in 6 months  -Message to Dr. Henrene Pastor re: EGD  -UA positive, begin cipro; culture is pending  -Add on IgG to today's lab   Orders Placed This Encounter   Procedures  . Urine Culture    Standing Status:   Future    Number of Occurrences:   1    Standing Expiration Date:   05/26/2019  . US Abdomen Complete    Standing Status:   Future    Standing Expiration Date:   05/26/2019    Order Specific Question:   Reason for Exam (SYMPTOM  OR DIAGNOSIS REQUIRED)    Answer:   hepatic cirrhosis, screening    Order Specific Question:   Preferred imaging location?    Answer:   Internal  . Urinalysis, Complete w Microscopic    Standing Status:   Future    Number of Occurrences:   1    Standing Expiration Date:   05/26/2019  . CBC with Differential (Cancer Center Only)    Standing Status:   Standing    Number of Occurrences:   10    Standing Expiration Date:   05/26/2019  . CMP ([facility] only)    Standing Status:   Standing    Number of Occurrences:   10    Standing Expiration Date:   05/26/2019  . AFP tumor marker    Standing Status:   Standing    Number of Occurrences:   10    Standing Expiration Date:   05/26/2019  . IgG    Standing Status:   Future    Number of Occurrences:   1    Standing Expiration Date:   05/26/2019    All questions were answered. The patient knows to call the clinic with any problems, questions or concerns.     Alla Feeling, NP 05/26/2018 4:44 PM  Addendum  I have seen the patient, examined her. I agree with the assessment and and plan and have edited the notes.   I have reviewed her chart extensively, including her lab and image results. Her moderate thrombocytopenia has been stable, does not require any treatment now. We discussed the treatment option of steroid (dexamethasone) and TPO receptor agonist if she  develops severe thrombocytopenia or before surgery or biopsy. We also discussed her liver cirrhosis management and screening for liver cancer. She will have CBC checked every 2 months at [facility], and we will see her back in 6 months for follow up.  We answered all pt's questions to her satisfaction.  Total time spent 40 mins, >50% on face to face counseling.   Truitt Merle  05/26/2018

## 2018-05-27 ENCOUNTER — Telehealth: Payer: Self-pay

## 2018-05-27 ENCOUNTER — Telehealth: Payer: Self-pay | Admitting: Nurse Practitioner

## 2018-05-27 LAB — IGG: IgG (Immunoglobin G), Serum: 883 mg/dL (ref 586–1602)

## 2018-05-27 LAB — URINE CULTURE: Culture: >=100000 — AB

## 2018-05-27 NOTE — Telephone Encounter (Signed)
Patient was contacted to schedule a Webex visit with Dr. Henrene Pastor.  She declined to schedule.  She wants to call back when Covi-19 is over.

## 2018-05-27 NOTE — Telephone Encounter (Signed)
Faxed order for labs to [facility] in [place] [phone], sent to HIM for scan.

## 2018-05-27 NOTE — Telephone Encounter (Signed)
Scheduled appt per 4/30 los.  A calendar will be mailed.

## 2018-05-27 NOTE — Telephone Encounter (Signed)
Noted  

## 2018-05-27 NOTE — Telephone Encounter (Signed)
-----   Message from Irene Shipper, MD sent at 05/26/2018  4:43 PM EDT ----- Regarding: visit Yvonne Carrillo, Oncology referral for cirrhosis. Please arrange webex visit. Thanks jp ----- Message ----- From: Alla Feeling, NP Sent: 05/26/2018   4:31 PM EDT To: Irene Shipper, MD, Truitt Merle, MD  Hi Dr. Henrene Pastor, We took over this lady's care from Dr. Marissa Nestle have done her colonoscopy in the past. CT in 2016 showed cirrhosis, esophageal varices, portal hypertension. She has not had EGD, she is agreeable to discuss this with you. Can your office please reach out to her. We plan to repeat AFP and abd [place] in 6 months.   Thanks, Cira Rue, NP

## 2018-05-30 ENCOUNTER — Other Ambulatory Visit: Payer: Self-pay | Admitting: Nurse Practitioner

## 2018-05-30 ENCOUNTER — Telehealth: Payer: Self-pay

## 2018-05-30 DIAGNOSIS — A491 Streptococcal infection, unspecified site: Secondary | ICD-10-CM

## 2018-05-30 MED ORDER — NITROFURANTOIN MONOHYD MACRO 100 MG PO CAPS: 100.0000 mg | ORAL_CAPSULE | Freq: Two times a day (BID) | ORAL | 0 refills | Status: DC

## 2018-05-30 NOTE — Telephone Encounter (Signed)
Return TC  from Ms. Yarberry in reference to receiving a prescription at her pharmacy for another antibiotic without explanation. Informed Pt. That I spoke with Lacie and she informed me that the reason she was sent another prescription, because she was prescribed Cipro because of her allergy to penicillin but Macrobid would work better with the strain of UTI she has. Pt. Stated she had finished the Cipro and was apprehensive about taking another antibiotic. She stated that her symptoms subsided and that she just had a mild burning sensation. Per Lacie she could pick up the prescription just in case the symptoms return. And follow up with her Gyn. Pt. Verbalized understanding. No further problems or concerns noted at this time

## 2018-05-30 NOTE — Telephone Encounter (Signed)
Pt left message on Friday in reference to her urinalysis and urine culture results. Pt stated she wanted to know if the medication she was taking was sensitive to the bacteria in her urine. Lacie notified will contact Pt.

## 2018-10-10 ENCOUNTER — Ambulatory Visit (INDEPENDENT_AMBULATORY_CARE_PROVIDER_SITE_OTHER): Payer: Medicare Other | Admitting: Internal Medicine

## 2018-10-10 ENCOUNTER — Encounter: Payer: Self-pay | Admitting: Internal Medicine

## 2018-10-10 VITALS — BP 148/74 | HR 80 | Temp 98.1°F | Ht 64.75 in | Wt 177.1 lb

## 2018-10-10 DIAGNOSIS — K7460 Unspecified cirrhosis of liver: Secondary | ICD-10-CM | POA: Diagnosis not present

## 2018-10-10 DIAGNOSIS — K766 Portal hypertension: Secondary | ICD-10-CM | POA: Diagnosis not present

## 2018-10-10 DIAGNOSIS — I8500 Esophageal varices without bleeding: Secondary | ICD-10-CM

## 2018-10-10 NOTE — Progress Notes (Signed)
HISTORY OF PRESENT ILLNESS:  Yvonne Carrillo is a 73 y.o. female, former radiology coordinator from [place], with past medical history as listed below including cryptogenic cirrhosis (extensively worked up elsewhere) who was sent today by hematology regarding screening (for varices) EGD.  Patient was seen in this office as a new patient November 25, 2016 by the GI physician assistant.  See that dictation for details.  She subsequently underwent surveillance colonoscopy due to history of nonadvanced adenomas in [place] in 2013.  This examination was performed December 01, 2016.  She was found to have left-sided diverticulosis, hemorrhoids, and diminutive adenoma.  Follow-up in 5 years recommended.  Established with a new hematologist this past April.  I have reviewed that record.  She is under their watch regarding surveillance for [facility] with ultrasound and AFP periodically.  She is sent to this office specifically for screening EGD.  Patient does tell me that she had some type of respiratory illness this winter and subsequently some issues with sore throat of burning in her throat region which an ENT specialist told her was "silent reflux".  She was taking Gaviscon.  She wonders if this diagnosis is correct or incorrect.  No classic reflux symptoms.  No dysphasia.  She also has questions regarding the need for hemorrhoidal banding as she knows that she has hemorrhoids.  However she has absolutely no symptoms related to hemorrhoids.  Review of blood work from April 2020 shows unremarkable comprehensive metabolic panel.  Normal liver test.  Normal albumin.  CBC at that time remarkable for thrombocytopenia with platelets 61,000.  Hemoglobin 11.5.  Last INR 2 years ago was 1.2.  Review of relevant radiology includes abdominal ultrasound with elastography December 2016.  The fibrosis score was F2 with some F3.  Changes of hepatic cirrhosis and splenomegaly noted.  The patient's GI review of systems is essentially  negative.  REVIEW OF SYSTEMS:  All non-GI ROS negative unless otherwise stated in the HPI except for excessive urination, urinary frequency, sleeping problems  Past Medical History:  Diagnosis Date  . Anemia    not current  . Cancer (HCC)    basal and squamous cell carcinoma  . Chronic interstitial cystitis 03/05/2014  . Diabetes mellitus without complication ([place])   . Hepatic cirrhosis ([place]) 05/08/2014  . Hyperlipidemia   . Hypertension   . Laryngopharyngeal reflux (LPR)   . Osteopenia   . Thrombocytopenia ([place]) 03/05/2014   platelet count runs low and very low at times    Past Surgical History:  Procedure Laterality Date  . BRAIN MENINGIOMA EXCISION    . COLONOSCOPY    . LAMINECTOMY    . PARTIAL HYSTERECTOMY     1993  . POLYPECTOMY     [name] in [place], [place]  reports that she has never smoked. She has never used smokeless tobacco. She reports that she does not drink alcohol or use drugs.  family history includes COPD in her mother; Heart disease in her father.  Allergies  Allergen Reactions  . Epinephrine Other (See Comments)    Increase heart rate  . Penicillins Rash       PHYSICAL EXAMINATION: Vital signs: BP (!) 148/74 (BP Location: Left Arm, Patient Position: Sitting, Cuff Size: Normal)   Pulse 80   Temp 98.1 F (36.7 C)   Ht 5' 4.75" (1.645 m) Comment: height measured without shoes  Wt 177 lb 2 oz (80.3 kg)   BMI 29.70 kg/m   Constitutional: generally  well-appearing, no acute distress Psychiatric: alert and oriented x3, cooperative Eyes: extraocular movements intact, anicteric, conjunctiva pink Mouth: oral pharynx moist, no lesions Neck: supple no lymphadenopathy Cardiovascular: heart regular rate and rhythm, no murmur Lungs: clear to auscultation bilaterally Abdomen: soft, nontender, nondistended, no obvious ascites, no peritoneal signs, normal bowel sounds, no organomegaly Rectal: Omitted Extremities: no  clubbing, cyanosis, or lower extremity edema bilaterally Skin: no lesions on visible extremities Neuro: No focal deficits. No asterixis.    ASSESSMENT:  1.  Cryptogenic cirrhosis.  Compensated. 2.  Portal hypertension on imaging 3.  Thrombocytopenia related to the same.  Being followed by hematology/oncology 4.  History of adenomatous colon polyps.  Surveillance up-to-date 5.  Question of reflux raised.  Seems doubtful to me based on history  PLAN:  1.  Upper endoscopy to screen for esophageal varices.  We discussed the implications of various endoscopic findings.The nature of the procedure, as well as the risks, benefits, and alternatives were carefully and thoroughly reviewed with the patient. Ample time for discussion and questions allowed. The patient understood, was satisfied, and agreed to proceed. 2.  Assess for objective evidence of reflux at the time of endoscopy 3.  Surveillance colonoscopy around November 2023 4.  Ongoing general medical care with PCP and hematology/oncology care with her new team at the cancer center

## 2018-10-10 NOTE — Patient Instructions (Signed)
You have been scheduled for an endoscopy. Please follow written instructions given to you at your visit today. If you use inhalers (even only as needed), please bring them with you on the day of your procedure.   

## 2018-10-17 ENCOUNTER — Encounter: Payer: Self-pay | Admitting: Internal Medicine

## 2018-10-25 ENCOUNTER — Telehealth: Payer: Self-pay | Admitting: Internal Medicine

## 2018-10-25 ENCOUNTER — Telehealth: Payer: Self-pay

## 2018-10-25 NOTE — Telephone Encounter (Signed)
All questions answered

## 2018-10-25 NOTE — Telephone Encounter (Signed)
Pls call pt, she is scheduled for procedure tomorrow and has several questions.

## 2018-10-25 NOTE — Telephone Encounter (Signed)
Covid-19 screening questions   Do you now or have you had a fever in the last 14 days?  Do you have any respiratory symptoms of shortness of breath or cough now or in the last 14 days?  Do you have any family members or close contacts with diagnosed or suspected Covid-19 in the past 14 days?  Have you been tested for Covid-19 and found to be positive?       

## 2018-10-26 ENCOUNTER — Other Ambulatory Visit (INDEPENDENT_AMBULATORY_CARE_PROVIDER_SITE_OTHER): Payer: Medicare Other

## 2018-10-26 ENCOUNTER — Encounter: Payer: Self-pay | Admitting: Internal Medicine

## 2018-10-26 ENCOUNTER — Other Ambulatory Visit: Payer: Self-pay

## 2018-10-26 ENCOUNTER — Ambulatory Visit (AMBULATORY_SURGERY_CENTER): Payer: Medicare Other | Admitting: Internal Medicine

## 2018-10-26 ENCOUNTER — Telehealth: Payer: Self-pay | Admitting: Internal Medicine

## 2018-10-26 VITALS — BP 131/62 | HR 77 | Temp 97.8°F | Resp 17 | Ht 64.0 in | Wt 177.0 lb

## 2018-10-26 DIAGNOSIS — K3189 Other diseases of stomach and duodenum: Secondary | ICD-10-CM

## 2018-10-26 DIAGNOSIS — K766 Portal hypertension: Secondary | ICD-10-CM

## 2018-10-26 DIAGNOSIS — I8500 Esophageal varices without bleeding: Secondary | ICD-10-CM

## 2018-10-26 DIAGNOSIS — K7460 Unspecified cirrhosis of liver: Secondary | ICD-10-CM

## 2018-10-26 LAB — COMPREHENSIVE METABOLIC PANEL
ALT: 20 U/L (ref 0–35)
AST: 30 U/L (ref 0–37)
Albumin: 4.6 g/dL (ref 3.5–5.2)
Alkaline Phosphatase: 73 U/L (ref 39–117)
BUN: 14 mg/dL (ref 6–23)
CO2: 26 mEq/L (ref 19–32)
Calcium: 9.8 mg/dL (ref 8.4–10.5)
Chloride: 108 mEq/L (ref 96–112)
Creatinine, Ser: 0.76 mg/dL (ref 0.40–1.20)
GFR: 74.58 mL/min (ref >60.00)
Glucose, Bld: 124 mg/dL — ABNORMAL HIGH (ref 70–99)
Potassium: 4.3 mEq/L (ref 3.5–5.1)
Sodium: 143 mEq/L (ref 135–145)
Total Bilirubin: 1.1 mg/dL (ref 0.2–1.2)
Total Protein: 7 g/dL (ref 6.0–8.3)

## 2018-10-26 LAB — CBC WITH DIFFERENTIAL/PLATELET
Basophils Absolute: 0 10*3/uL (ref 0.0–0.1)
Basophils Relative: 0.7 % (ref 0.0–3.0)
Eosinophils Absolute: 0 10*3/uL (ref 0.0–0.7)
Eosinophils Relative: 1.4 % (ref 0.0–5.0)
HCT: 34.7 % — ABNORMAL LOW (ref 36.0–46.0)
Hemoglobin: 11.6 g/dL — ABNORMAL LOW (ref 12.0–15.0)
Lymphocytes Relative: 18.4 % (ref 12.0–46.0)
Lymphs Abs: 0.6 10*3/uL — ABNORMAL LOW (ref 0.7–4.0)
MCHC: 33.4 g/dL (ref 30.0–36.0)
MCV: 85.7 fl (ref 78.0–100.0)
Monocytes Absolute: 0.3 10*3/uL (ref 0.1–1.0)
Monocytes Relative: 10.7 % (ref 3.0–12.0)
Neutro Abs: 2.2 10*3/uL (ref 1.4–7.7)
Neutrophils Relative %: 68.8 % (ref 43.0–77.0)
Platelets: 55 10*3/uL — ABNORMAL LOW (ref 150.0–400.0)
RBC: 4.04 Mil/uL (ref 3.87–5.11)
RDW: 13.9 % (ref 11.5–15.5)
WBC: 3.2 10*3/uL — ABNORMAL LOW (ref 4.0–10.5)

## 2018-10-26 LAB — FERRITIN: Ferritin: 33.2 ng/mL (ref 10.0–291.0)

## 2018-10-26 LAB — PROTIME-INR
INR: 1.3 ratio — ABNORMAL HIGH (ref 0.8–1.0)
Prothrombin Time: 14.6 s — ABNORMAL HIGH (ref 9.6–13.1)

## 2018-10-26 MED ORDER — SODIUM CHLORIDE 0.9 % IV SOLN: 500.0000 mL | Freq: Once | INTRAVENOUS | Status: DC

## 2018-10-26 MED ORDER — NADOLOL 20 MG PO TABS: 20.0000 mg | ORAL_TABLET | Freq: Every day | ORAL | 11 refills | Status: DC

## 2018-10-26 NOTE — Progress Notes (Signed)
Temp check by KA/vital check by CW.  Reviewed medical and surgical history with the patient.

## 2018-10-26 NOTE — Op Note (Signed)
[facility] Patient Name: Yvonne Carrillo Procedure Date: 10/26/2018 8:46 AM MRN: [id number] Endoscopist: Docia Chuck. Henrene Pastor , MD Age: 73 Referring MD:  Date of Birth: 04-25-1945 Gender: Female Account #: [id number] Procedure:                Upper GI endoscopy Indications:              Cirrhosis rule out esophageal varices Medicines:                Monitored Anesthesia Care Procedure:                Pre-Anesthesia Assessment:                           - Prior to the procedure, a History and Physical                            was performed, and patient medications and                            allergies were reviewed. The patient's tolerance of                            previous anesthesia was also reviewed. The risks                            and benefits of the procedure and the sedation                            options and risks were discussed with the patient.                            All questions were answered, and informed consent                            was obtained. Prior Anticoagulants: The patient has                            taken no previous anticoagulant or antiplatelet                            agents. ASA Grade Assessment: III - A patient with                            severe systemic disease. After reviewing the risks                            and benefits, the patient was deemed in                            satisfactory condition to undergo the procedure.                           After obtaining informed consent, the endoscope was  passed under direct vision. Throughout the                            procedure, the patient's blood pressure, pulse, and                            oxygen saturations were monitored continuously. The                            Endoscope was introduced through the mouth, and                            advanced to the second part of duodenum. The upper                            GI endoscopy was  accomplished without difficulty.                            The patient tolerated the procedure well. Scope In: Scope Out: Findings:                 Varices were found in the lower third of the                            esophagus. There was one column of 2-3+ varices                            without stigmata and 2 columns of trace to 1+                            varices.                           Moderate portal hypertensive gastropathy was found                            in the entire examined stomach with hematin                            throughout.                           The stomach was otherwise normal. No proximal                            gastric varices                           The examined duodenum was normal.                           The cardia and gastric fundus were normal on                            retroflexion. Complications:  No immediate complications. Estimated Blood Loss:     Estimated blood loss: none. Impression:               1. 2 small columns of varices and one moderate                            variceal column without stigmata                           2. Moderate portal hypertensive gastropathy with                            hematin throughout.                           3. Otherwise normal EGD Recommendation:           1. Blood work today including CBC, PT/INR,                            comprehensive metabolic panel, and ferritin level                           2. Prescribe nadolol 20 mg daily; #30; 11 refills                           3. Please pick up over-the-counter iron supplement                            and take 1 daily                           4. Routine office follow-up with Dr. Henrene Pastor in 4                            weeks. Docia Chuck. Henrene Pastor, MD 10/26/2018 9:15:23 AM This report has been signed electronically.

## 2018-10-26 NOTE — Patient Instructions (Signed)
NEW PRESCRIPTION SENT TO [facility] IN [place], NADOLOL 20 MG DAILY   START AN OVER THE COUNTER IRON DAILY  LAB WORK TODAY: CBC, PT/INR,CMP AND FERITIN LEVEL TODAY PRIOR TO LEAVING      YOU HAD AN ENDOSCOPIC PROCEDURE TODAY AT [facility]:   Refer to the procedure report that was given to you for any specific questions about what was found during the examination.  If the procedure report does not answer your questions, please call your gastroenterologist to clarify.  If you requested that your care partner not be given the details of your procedure findings, then the procedure report has been included in a sealed envelope for you to review at your convenience later.  YOU SHOULD EXPECT: Some feelings of bloating in the abdomen. Passage of more gas than usual.  Walking can help get rid of the air that was put into your GI tract during the procedure and reduce the bloating. If you had a lower endoscopy (such as a colonoscopy or flexible sigmoidoscopy) you may notice spotting of blood in your stool or on the toilet paper. If you underwent a bowel prep for your procedure, you may not have a normal bowel movement for a few days.  Please Note:  You might notice some irritation and congestion in your nose or some drainage.  This is from the oxygen used during your procedure.  There is no need for concern and it should clear up in a day or so.  SYMPTOMS TO REPORT IMMEDIATELY:   Following upper endoscopy (EGD)  Vomiting of blood or coffee ground material  New chest pain or pain under the shoulder blades  Painful or persistently difficult swallowing  New shortness of breath  Fever of 100F or higher  Black, tarry-looking stools  For urgent or emergent issues, a gastroenterologist can be reached at any hour by calling [phone].   DIET:  We do recommend a small meal at first, but then you may proceed to your regular diet.  Drink plenty of fluids but you should avoid  alcoholic beverages for 24 hours.  ACTIVITY:  You should plan to take it easy for the rest of today and you should NOT DRIVE or use heavy machinery until tomorrow (because of the sedation medicines used during the test).    FOLLOW UP: Our staff will call the number listed on your records 48-72 hours following your procedure to check on you and address any questions or concerns that you may have regarding the information given to you following your procedure. If we do not reach you, we will leave a message.  We will attempt to reach you two times.  During this call, we will ask if you have developed any symptoms of COVID 19. If you develop any symptoms (ie: fever, flu-like symptoms, shortness of breath, cough etc.) before then, please call [phone].  If you test positive for Covid 19 in the 2 weeks post procedure, please call and report this information to [place].    If any biopsies were taken you will be contacted by phone or by letter within the next 1-3 weeks.  Please call us at [phone] if you have not heard about the biopsies in 3 weeks.    SIGNATURES/CONFIDENTIALITY: You and/or your care partner have signed paperwork which will be entered into your electronic medical record.  These signatures attest to the fact that that the information above on your After Visit Summary has been reviewed and is understood.  Full responsibility of the confidentiality of this discharge information lies with you and/or your care-partner.

## 2018-10-26 NOTE — Telephone Encounter (Signed)
Pt calling wanting to know if it is ok for her to take the nadolol and her Ibersartan at the same time. Please advise.

## 2018-10-26 NOTE — Progress Notes (Signed)
Report to PACU, RN, vss, BBS= Clear.  

## 2018-10-27 NOTE — Telephone Encounter (Signed)
Spoke with pt and she is aware.

## 2018-10-27 NOTE — Telephone Encounter (Signed)
She should monitor her blood pressure at home (she told me that she can).  If her pressure is okay and she feels well, okay to take both medications.  However, if her blood pressure is low and she feels poorly because of it, stop the Avapro and continue on nadolol.  Thanks

## 2018-10-28 ENCOUNTER — Telehealth: Payer: Self-pay

## 2018-10-28 NOTE — Telephone Encounter (Signed)
Covid-19 screening questions   Do you now or have you had a fever in the last 14 days? No.  Do you have any respiratory symptoms of shortness of breath or cough now or in the last 14 days? No. Do you have any family members or close contacts with diagnosed or suspected Covid-19 in the past 14 days? No.  Have you been tested for Covid-19 and found to be positive? No.      Follow up Call-  Call back number 10/26/2018 12/01/2016  Post procedure Call Back phone  # [phone]  Permission to leave phone message Yes Yes  Some recent data might be hidden     Patient questions:  Do you have a fever, pain , or abdominal swelling? No. Pain Score  0 *  Have you tolerated food without any problems? Yes.    Have you been able to return to your normal activities? Yes.     Do you have any questions about your discharge instructions: Diet   No. Medications  No. Follow up visit  No.  Do you have questions or concerns about your Care? No.  Actions: * If pain score is 4 or above: No action needed, pain <4.

## 2018-11-03 ENCOUNTER — Telehealth: Payer: Self-pay | Admitting: Internal Medicine

## 2018-11-03 NOTE — Telephone Encounter (Signed)
Pt states she has been taking the nadolol 20mg  daily and she has noticed that her pulse rate is low. States it normally is in the 60's prior to starting the nadolol. This am when she first got up is was 82, she took it 15 min later and got 23 and then again 54. She states BP has been around 120/50. Reports she feels good, no headache, not tired. Just concerned about the low pulse. Please advise.

## 2018-11-03 NOTE — Telephone Encounter (Signed)
Spoke with pt and she is aware.

## 2018-11-03 NOTE — Telephone Encounter (Signed)
Actually, a decreased pulse rate is what we like to achieve as long as she is feeling well and her blood pressure is acceptable.  It sounds like she is feeling well and her blood pressure is clearly acceptable.  No changes.  I appreciate her checking.  She should continue to monitor until her follow-up.  Contact us in the interim if there are any clinical issues.  Thanks

## 2018-11-24 ENCOUNTER — Ambulatory Visit (HOSPITAL_COMMUNITY)
Admission: RE | Admit: 2018-11-24 | Discharge: 2018-11-24 | Disposition: A | Discharge status: Home / Self Care | Payer: Medicare Other | Source: Ambulatory Visit | Attending: Nurse Practitioner | Admitting: Nurse Practitioner

## 2018-11-24 ENCOUNTER — Other Ambulatory Visit: Payer: Self-pay

## 2018-11-24 ENCOUNTER — Inpatient Hospital Stay: Payer: Medicare Other | Admitting: Nurse Practitioner

## 2018-11-24 ENCOUNTER — Telehealth: Payer: Self-pay | Admitting: Hematology

## 2018-11-24 ENCOUNTER — Inpatient Hospital Stay: Payer: Medicare Other | Attending: Nurse Practitioner

## 2018-11-24 ENCOUNTER — Encounter: Payer: Self-pay | Admitting: Nurse Practitioner

## 2018-11-24 VITALS — BP 152/65 | HR 70 | Temp 98.2°F | Resp 17 | Ht 64.0 in | Wt 173.8 lb

## 2018-11-24 DIAGNOSIS — E119 Type 2 diabetes mellitus without complications: Secondary | ICD-10-CM | POA: Insufficient documentation

## 2018-11-24 DIAGNOSIS — K7469 Other cirrhosis of liver: Secondary | ICD-10-CM

## 2018-11-24 DIAGNOSIS — K7460 Unspecified cirrhosis of liver: Secondary | ICD-10-CM | POA: Diagnosis not present

## 2018-11-24 DIAGNOSIS — I10 Essential (primary) hypertension: Secondary | ICD-10-CM | POA: Diagnosis not present

## 2018-11-24 DIAGNOSIS — K3189 Other diseases of stomach and duodenum: Secondary | ICD-10-CM | POA: Insufficient documentation

## 2018-11-24 DIAGNOSIS — D61818 Other pancytopenia: Secondary | ICD-10-CM | POA: Insufficient documentation

## 2018-11-24 DIAGNOSIS — Z23 Encounter for immunization: Secondary | ICD-10-CM | POA: Diagnosis present

## 2018-11-24 DIAGNOSIS — K766 Portal hypertension: Secondary | ICD-10-CM | POA: Diagnosis not present

## 2018-11-24 DIAGNOSIS — D693 Immune thrombocytopenic purpura: Secondary | ICD-10-CM | POA: Diagnosis not present

## 2018-11-24 LAB — CMP (CANCER CENTER ONLY)
ALT: 17 U/L (ref 0–44)
AST: 24 U/L (ref 15–41)
Albumin: 4.2 g/dL (ref 3.5–5.0)
Alkaline Phosphatase: 79 U/L (ref 38–126)
Anion gap: 9 (ref 5–15)
BUN: 12 mg/dL (ref 8–23)
CO2: 27 mmol/L (ref 22–32)
Calcium: 9.8 mg/dL (ref 8.9–10.3)
Chloride: 105 mmol/L (ref 98–111)
Creatinine: 0.81 mg/dL (ref 0.44–1.00)
GFR, Est AFR Am: >60 mL/min (ref >60)
GFR, Estimated: >60 mL/min (ref >60)
Glucose, Bld: 133 mg/dL — ABNORMAL HIGH (ref 70–99)
Potassium: 4.3 mmol/L (ref 3.5–5.1)
Sodium: 141 mmol/L (ref 135–145)
Total Bilirubin: 1 mg/dL (ref 0.3–1.2)
Total Protein: 6.8 g/dL (ref 6.5–8.1)

## 2018-11-24 LAB — CBC WITH DIFFERENTIAL (CANCER CENTER ONLY)
Abs Immature Granulocytes: 0.01 10*3/uL (ref 0.00–0.07)
Basophils Absolute: 0 10*3/uL (ref 0.0–0.1)
Basophils Relative: 0 %
Eosinophils Absolute: 0.1 10*3/uL (ref 0.0–0.5)
Eosinophils Relative: 2 %
HCT: 35.7 % — ABNORMAL LOW (ref 36.0–46.0)
Hemoglobin: 11.2 g/dL — ABNORMAL LOW (ref 12.0–15.0)
Immature Granulocytes: 0 %
Lymphocytes Relative: 16 %
Lymphs Abs: 0.6 10*3/uL — ABNORMAL LOW (ref 0.7–4.0)
MCH: 28.3 pg (ref 26.0–34.0)
MCHC: 31.4 g/dL (ref 30.0–36.0)
MCV: 90.2 fL (ref 80.0–100.0)
Monocytes Absolute: 0.5 10*3/uL (ref 0.1–1.0)
Monocytes Relative: 14 %
Neutro Abs: 2.5 10*3/uL (ref 1.7–7.7)
Neutrophils Relative %: 68 %
Platelet Count: 54 10*3/uL — ABNORMAL LOW (ref 150–400)
RBC: 3.96 MIL/uL (ref 3.87–5.11)
RDW: 13.8 % (ref 11.5–15.5)
WBC Count: 3.7 10*3/uL — ABNORMAL LOW (ref 4.0–10.5)
nRBC: 0 % (ref 0.0–0.2)

## 2018-11-24 MED ORDER — INFLUENZA VAC A&B SA ADJ QUAD 0.5 ML IM PRSY
0.5000 mL | PREFILLED_SYRINGE | Freq: Once | INTRAMUSCULAR | Status: AC
  Administered 2018-11-24: 13:00:00 0.5 mL via INTRAMUSCULAR

## 2018-11-24 MED ORDER — INFLUENZA VAC A&B SA ADJ QUAD 0.5 ML IM PRSY
PREFILLED_SYRINGE | INTRAMUSCULAR | Status: AC
  Filled 2018-11-24: qty 0.5

## 2018-11-24 NOTE — Progress Notes (Signed)
[facility]   Telephone:(336) [phone] Fax:(336) [phone]   Clinic Follow up Note   Patient Care Team: Tobe Sos, MD as PCP - General (Internal Medicine) Annia Belt, MD as Consulting Physician (Oncology) Altheimer, Legrand Como, MD as Consulting Physician (Endocrinology) Carol Ada, MD as Consulting Physician (Gastroenterology) Irene Shipper, MD as Consulting Physician (Gastroenterology) Truitt Merle, MD as Consulting Physician (Hematology) Alla Feeling, NP as Nurse Practitioner (Nurse Practitioner) 11/24/2018  CHIEF COMPLAINT: F/u pancytopenia   CURRENT THERAPY: Oral iron once daily starting 10/8 per GI   INTERVAL HISTORY:  Ms. Rollyson returns for f/u as scheduled. She was last seen by [place] in 04/2018. Liver US did not get scheduled. She recently saw Dr. Henrene Pastor and underwent endoscopy which showed varices in the distal esophagus. She was started on oral iron and nadolol. She will f/u with Dr. Henrene Pastor again soon. She is doing well. Denies major changes in her health. She had pessary placed in 07/2018 which has helped empty her bladder during the day, but she still gets up often at night. Denies recent infection or fever. Denies bleeding. Stools are darker since starting iron, she takes stool softener PRN for constipation. Appetite is normal. Denies fever, chills, cough, chest pain, dyspnea.     MEDICAL HISTORY:  Past Medical History:  Diagnosis Date  . Anemia    not current  . Cancer (HCC)    basal and squamous cell carcinoma  . Chronic interstitial cystitis 03/05/2014  . Diabetes mellitus without complication ([place])   . Hepatic cirrhosis ([place]) 05/08/2014  . Hyperlipidemia   . Hypertension   . Laryngopharyngeal reflux (LPR)   . Osteopenia   . Thrombocytopenia ([place]) 03/05/2014   platelet count runs low and very low at times    SURGICAL HISTORY: Past Surgical History:  Procedure Laterality Date  . BRAIN MENINGIOMA EXCISION    . COLONOSCOPY    .  LAMINECTOMY    . PARTIAL HYSTERECTOMY     1993  . POLYPECTOMY     [name] in [place], [place]    I have reviewed the social history and family history with the patient and they are unchanged from previous note.  ALLERGIES:  is allergic to epinephrine; lidocaine; lisinopril; and penicillins.  MEDICATIONS:  Current Outpatient Medications  Medication Sig Dispense Refill  . Calcium Carbonate-Vitamin D (CALCIUM-VITAMIN D) 600-125 MG-UNIT TABS Take by mouth daily.    Marland Kitchen ELDERBERRY PO Take by mouth daily.    . ferrous sulfate 324 MG TBEC Take 324 mg by mouth daily.    . nadolol (CORGARD) 20 MG tablet Take 1 tablet (20 mg total) by mouth daily. 30 tablet 11  . calcium citrate-vitamin D (CITRACAL+D) 315-200 MG-UNIT tablet Take 1 tablet by mouth 2 (two) times daily.    . cholecalciferol (VITAMIN D) 400 UNITS TABS tablet Take 2,000 Units by mouth daily.    . irbesartan (AVAPRO) 150 MG tablet Take 150 mg by mouth daily.    . simvastatin (ZOCOR) 20 MG tablet Take 1 tablet by mouth daily.    . sitaGLIPtin-metformin (JANUMET) 50-1000 MG per tablet Take 1 tablet by mouth daily.      No current facility-administered medications for this visit.     PHYSICAL EXAMINATION: ECOG PERFORMANCE STATUS: 0 - Asymptomatic  Vitals:   11/24/18 1201 11/24/18 1306  BP: (!) 156/68 (!) 152/65  Pulse: 70   Resp: 17   Temp: 98.2 F (36.8 C)   SpO2: 98%    Filed Weights  11/24/18 1201  Weight: 173 lb 12.8 oz (78.8 kg)    GENERAL:alert, no distress and comfortable SKIN: no rash  EYES:  sclera clear LUNGS: clear with normal breathing effort HEART: regular rate & rhythm, no lower extremity edema ABDOMEN:abdomen soft, non-tender and normal bowel sounds. No ascites  NEURO: alert & oriented x 3 with fluent speech  LABORATORY DATA:  I have reviewed the data as listed CBC Latest Ref Rng & Units 11/24/2018 10/26/2018 05/26/2018  WBC 4.0 - 10.5 K/uL 3.7(L) 3.2(L) 4.7  Hemoglobin 12.0 - 15.0 g/dL 11.2(L) 11.6(L)  11.5(L)  Hematocrit 36.0 - 46.0 % 35.7(L) 34.7(L) 36.7  Platelets 150 - 400 K/uL 54(L) 55.0(L) 61(L)     CMP Latest Ref Rng & Units 11/24/2018 10/26/2018 05/26/2018  Glucose 70 - 99 mg/dL 133(H) 124(H) 128(H)  BUN 8 - 23 mg/dL 12 14 10   Creatinine 0.44 - 1.00 mg/dL 0.81 0.76 0.83  Sodium 135 - 145 mmol/L 141 143 143  Potassium 3.5 - 5.1 mmol/L 4.3 4.3 4.1  Chloride 98 - 111 mmol/L 105 108 106  CO2 22 - 32 mmol/L 27 26 25   Calcium 8.9 - 10.3 mg/dL 9.8 9.8 10.3  Total Protein 6.5 - 8.1 g/dL 6.8 7.0 7.2  Total Bilirubin 0.3 - 1.2 mg/dL 1.0 1.1 1.0  Alkaline Phos 38 - 126 U/L 79 73 91  AST 15 - 41 U/L 24 30 25   ALT 0 - 44 U/L 17 20 17       RADIOGRAPHIC STUDIES: I have personally reviewed the radiological images as listed and agreed with the findings in the report. No results found.   ASSESSMENT & PLAN: 73 y/o female with DM, HTN, chronic interstitial cystitis, and pancytopenia   1. 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 Bone marrow biopsy was normal. Work up revealed cirrhosis as the cause of her splenomegaly and pancytopenia.  -there appears to me immune component of her thrombocytopenia, as it responded to steroids in the past. She is intolerant to prednisone.  -Consider dexamethasone for ITP flare if plt <30K -If platelets fall between 10-20K, or she has active bleeding we would consider plt transfusion -Consider possible administration of growth factor to stimulate platelet production if platelets fall between 30-50K before surgery or procedure.  -CBC is stable, WBC 3.7, PLT 55K, Hgb 11.2  2. Liver cirrhosis, cryptogenic  -she underwent very thorough work up by Dr. Beryle Beams which was essentially unremarkable, the cause for her liver cirrhosis remains unknown. -CT in 2016 showed esophageal varices and findings consistent with  portal venous hypertension -She underwent upper endoscopy on 10/26/2018 by Dr. Henrene Pastor which showed 2-3+ varices in the lower third esophagus moderate portal hypertensive gastropathy.  In the entire stomach..  The exam was otherwise.  She was started on nadolol and oral iron for ferritin of 33  -Continue follow-up with Dr. Henrene Pastor -AFP and liver ultrasound for [facility] screening are pending from today  3. Multiple infections -from February through April, she has had recurrent infections including sinusitis, otitis media with effusions, and bronchitis. She has required multiple courses of antibiotics  -IgG was normal, no need for IVIG -she was treated for UTI in 04/2018 -f/u with GYN for cystocele; she recently underwent pessary placement in 07/2018  4. DM, HTN -we reviewed DM is associated with recurrent infections.  -her sugar has been controlled lately, A1c 6-7; BG 128 today -on Janumet -she will continue  f/u with endocrine and PCP -recently started nadolol   Disposition:  Ms. Kohlman appears well. Her CBC remains stable with mild pancytopenia, WBC 3.7, Hgb 11.2, PLT 55. She is not symptomatic, denies bleeding or recent infection. CMP is unremarkable. Will continue to monitor CBC closely, no need for additional work up at this time.   She underwent upper endoscopy per Dr. Henrene Pastor which confirms esophageal varices. She started nadolol and oral iron. She will continue f/u with Dr. Henrene Pastor. She will have abdominal US for [facility] screening after today's visit. AFP is pending. Will f/u with her on outstanding results.   She will return for lab in 3 months and f/u in 6 months, or sooner if [facility] screening is abnormal.   All questions were answered. The patient knows to call the clinic with any problems, questions or concerns. No barriers to learning was detected.    Alla Feeling, NP 11/24/18

## 2018-11-24 NOTE — Telephone Encounter (Signed)
I left a message regarding schedule  

## 2018-11-25 LAB — AFP TUMOR MARKER: AFP, Serum, Tumor Marker: 1.4 ng/mL (ref 0.0–8.3)

## 2018-11-28 ENCOUNTER — Telehealth: Payer: Self-pay | Admitting: *Deleted

## 2018-11-28 ENCOUNTER — Encounter: Payer: Self-pay | Admitting: Nurse Practitioner

## 2018-11-28 NOTE — Telephone Encounter (Signed)
Per Cira Rue, NP, called pt regarding normal AFP, LFT results, and no liver mass being seen on [place]. Pt verbalized understanding and advised if there were any concerns to call [facility] office.

## 2018-11-28 NOTE — Telephone Encounter (Signed)
-----   Message from Alla Feeling, NP sent at 11/26/2018  9:08 AM EDT ----- Please let her know AFP and LFTs are normal. No liver mass seen on [place]. Thanks, lacie

## 2018-11-29 ENCOUNTER — Other Ambulatory Visit (INDEPENDENT_AMBULATORY_CARE_PROVIDER_SITE_OTHER): Payer: Medicare Other

## 2018-11-29 ENCOUNTER — Ambulatory Visit: Payer: Medicare Other | Admitting: Internal Medicine

## 2018-11-29 ENCOUNTER — Other Ambulatory Visit: Payer: Self-pay

## 2018-11-29 ENCOUNTER — Encounter: Payer: Self-pay | Admitting: Internal Medicine

## 2018-11-29 VITALS — BP 152/80 | HR 76 | Temp 98.3°F | Ht 64.75 in | Wt 173.4 lb

## 2018-11-29 DIAGNOSIS — K3189 Other diseases of stomach and duodenum: Secondary | ICD-10-CM

## 2018-11-29 DIAGNOSIS — R300 Dysuria: Secondary | ICD-10-CM

## 2018-11-29 DIAGNOSIS — I8500 Esophageal varices without bleeding: Secondary | ICD-10-CM | POA: Diagnosis not present

## 2018-11-29 DIAGNOSIS — K766 Portal hypertension: Secondary | ICD-10-CM

## 2018-11-29 DIAGNOSIS — K7460 Unspecified cirrhosis of liver: Secondary | ICD-10-CM

## 2018-11-29 LAB — URINALYSIS, ROUTINE W REFLEX MICROSCOPIC
Bilirubin Urine: NEGATIVE
Hgb urine dipstick: NEGATIVE
Ketones, ur: NEGATIVE
Nitrite: NEGATIVE
Specific Gravity, Urine: <=1.005 — AB (ref 1.000–1.030)
Total Protein, Urine: NEGATIVE
Urine Glucose: NEGATIVE
Urobilinogen, UA: 0.2 (ref 0.0–1.0)
pH: 6 (ref 5.0–8.0)

## 2018-11-29 MED ORDER — NADOLOL 20 MG PO TABS: 20.0000 mg | ORAL_TABLET | Freq: Every day | ORAL | 3 refills | Status: AC

## 2018-11-29 NOTE — Patient Instructions (Signed)
We have sent the following medications to your pharmacy for you to pick up at your convenience:  Nadolol.  Your provider has requested that you go to the basement level for lab work before leaving today. Press "B" on the elevator. The lab is located at the first door on the left as you exit the elevator.

## 2018-11-29 NOTE — Progress Notes (Signed)
HISTORY OF PRESENT ILLNESS:  Yvonne Carrillo is a 73 y.o. female, former radiology coordinator from [place], with a history of cryptogenic cirrhosis extensively worked up elsewhere.  She has been followed by hematology/oncology.  Last seen in this office October 10, 2018.  Portal hypertension on imaging.  Thrombocytopenia related to the same.  She has a history of adenomatous colon polyps with surveillance up-to-date.  She underwent screening (for esophageal varices) upper endoscopy October 26, 2018.  She was found to have moderate varices without stigmata.  Also portal hypertensive gastropathy with hematin throughout.  Blood work revealed mild anemia with hemoglobin 11.6.  Prothrombin time 14.6 with INR 1.3.  Total bilirubin 1.1.  Creatinine 0.76 sodium 143.  Calculated MELD score of 10.  At the time of endoscopy she was initiated on nadolol 20 mg daily.  She contacted the office reporting low blood pressure.  Her other antihypertensive was discontinued.  She has continued on nadolol.  She has been feeling well and tolerating the medication without issues.  She has been recording blood pressure and heart rate every day.  She brings those records with her for my review.  Her numbers have been excellent with blood pressure averaging approximately 120/55 and heart rate averaging between upper 50s and low 60s.  She has multiple questions regarding her condition and her medication.  Also her blood work and iron therapy.  She also has questions regarding emergency should she have variceal bleeding.  Finally, she reports dysuria and wonders about possibly having a UTI (which she has had in the past).  She also has a history of interstitial cystitis.  She requests that we check a urinalysis.  Should be noted that the patient underwent abdominal ultrasound November 24, 2018 regarding cirrhosis and screening for liver lesions.  No focal mass identified.  Splenomegaly noted  REVIEW OF SYSTEMS:  All non-GI ROS  negative unless otherwise stated in the HPI except for urinary frequency  Past Medical History:  Diagnosis Date  . Anemia    not current  . Cancer (HCC)    basal and squamous cell carcinoma  . Chronic interstitial cystitis 03/05/2014  . Diabetes mellitus without complication ([place])   . Hepatic cirrhosis ([place]) 05/08/2014  . Hyperlipidemia   . Hypertension   . Laryngopharyngeal reflux (LPR)   . Osteopenia   . Thrombocytopenia ([place]) 03/05/2014   platelet count runs low and very low at times    Past Surgical History:  Procedure Laterality Date  . BRAIN MENINGIOMA EXCISION    . COLONOSCOPY    . LAMINECTOMY    . PARTIAL HYSTERECTOMY     1993  . POLYPECTOMY     [name] in [place], [place]  reports that she has never smoked. She has never used smokeless tobacco. She reports that she does not drink alcohol or use drugs.  family history includes COPD in her mother; Heart disease in her father.  Allergies  Allergen Reactions  . Epinephrine Other (See Comments)    Increase heart rate  . Lidocaine   . Lisinopril Cough  . Penicillins Rash       PHYSICAL EXAMINATION: Vital signs: BP (!) 152/80 (BP Location: Left Arm, Patient Position: Sitting, Cuff Size: Normal)   Pulse 76 Comment: irregular  Temp 98.3 F (36.8 C)   Ht 5' 4.75" (1.645 m)   Wt 173 lb 6 oz (78.6 kg)   BMI 29.07 kg/m   Constitutional: generally well-appearing, no acute distress Psychiatric:  alert and oriented x3, cooperative Eyes: extraocular movements intact, anicteric, conjunctiva pink Mouth: oral pharynx moist, no lesions Neck: supple no lymphadenopathy Cardiovascular: heart regular rate and rhythm, no murmur Lungs: clear to auscultation bilaterally Abdomen: soft, nontender, nondistended, no obvious ascites, no peritoneal signs, normal bowel sounds, no organomegaly Rectal: Omitted Extremities: no clubbing, cyanosis, or lower extremity edema bilaterally Skin: no lesions on  visible extremities Neuro: No focal deficits. No asterixis.    ASSESSMENT:  1.  Cryptogenic cirrhosis.  Compensated.  Meld score 10 2.  Portal hypertension with thrombocytopenia, splenomegaly, portal hypertensive gastropathy, and esophageal varices.  Now on nadolol and iron 3.  General medical problems including diabetes 4.  History of adenomatous colon polyps.  Surveillance up-to-date 5.  Dysuria.  Rule out UTI  PLAN:  1.  Discussion regarding her liver disease.  Multiple questions asked and answered. 2.  Continue nadolol 20 mg daily for variceal prophylaxis.  Medication refilled per patient request.  90-day supply.  Medication effects, side effects, and risks reviewed.  Also instructed that should she develop acute GI bleeding she needs to go to the closest emergency room immediately. 3.  Continue iron supplementation daily 4.  Continue to monitor blood pressure and heart rate 5.  Check urinalysis today.  If abnormal defer to PCP 6.  Routine GI follow-up 6 months.  Sooner if needed 25-minute spent face-to-face with the patient.  Greater than 50% the time used for counseling regarding her advanced liver disease, its management, and assessment for UTI  ADDENDUM: Urinalysis mildly abnormal.  Possible early UTI.  Patient notified and to address with PCP

## 2018-11-29 NOTE — Progress Notes (Signed)
Pt aware, copy sent to pts PCP and pt knows to contact her PCP regarding possible treatment.

## 2018-12-02 ENCOUNTER — Encounter: Payer: Self-pay | Admitting: Nurse Practitioner

## 2018-12-07 ENCOUNTER — Encounter: Payer: Self-pay | Admitting: Nurse Practitioner

## 2019-02-24 ENCOUNTER — Other Ambulatory Visit: Payer: Medicare Other

## 2019-03-09 ENCOUNTER — Encounter: Payer: Self-pay | Admitting: Nurse Practitioner

## 2019-03-21 ENCOUNTER — Encounter: Payer: Self-pay | Admitting: Nurse Practitioner

## 2019-03-22 ENCOUNTER — Encounter: Payer: Self-pay | Admitting: Nurse Practitioner

## 2019-04-02 ENCOUNTER — Encounter: Payer: Self-pay | Admitting: Nurse Practitioner

## 2019-04-03 ENCOUNTER — Telehealth: Payer: Self-pay

## 2019-04-03 NOTE — Telephone Encounter (Signed)
PROGRESS NOTE: Contacted Myrtie Neither in HIM in regard to patients question about wanting a copy of her last labs that were done at [facility] in [facility] March 09, 2019. She let me know that they have not received any results from [facility] on this patient.

## 2019-05-24 ENCOUNTER — Other Ambulatory Visit: Payer: Medicare Other

## 2019-05-24 ENCOUNTER — Ambulatory Visit: Payer: Medicare Other | Admitting: Hematology

## 2019-05-31 NOTE — Progress Notes (Signed)
[name]   [phone] [phone]   Clinic Follow up Note   Patient Care Team: Tobe Sos, MD as PCP - General (Internal Medicine) Annia Belt, MD as Consulting Physician (Oncology) Altheimer, Legrand Como, MD as Consulting Physician (Endocrinology) Carol Ada, MD as Consulting Physician (Gastroenterology) Irene Shipper, MD as Consulting Physician (Gastroenterology) Truitt Merle, MD as Consulting Physician (Hematology) Alla Feeling, NP as Nurse Practitioner (Nurse Practitioner)  Date of Service:  06/01/2019  CHIEF COMPLAINT: F/u pancytopenia    CURRENT THERAPY:  Oral iron once daily starting 10/8 per GI   INTERVAL HISTORY:  Yvonne Carrillo is here for a follow up of pancytopenia. She presents to the clinic alone. She notes she is doing well. She denies any new changes. She notes having mild bruises on her forearm. She denies gum or nose bleeding and no hematochezia or hematuria. She denies any infections. She has seen Dr. Henrene Pastor this year and will see again 6 months later. She has received her COVID19 vaccines. She notes her A1c is 6.6 range currently.     REVIEW OF SYSTEMS:   Constitutional: Denies fevers, chills or abnormal weight loss Eyes: Denies blurriness of vision Ears, nose, mouth, throat, and face: Denies mucositis or sore throat Respiratory: Denies cough, dyspnea or wheezes Cardiovascular: Denies palpitation, chest discomfort or lower extremity swelling Gastrointestinal:  Denies nausea, heartburn or change in bowel habits Skin: Denies abnormal skin rashes Lymphatics: Denies new lymphadenopathy or easy bruising Neurological:Denies numbness, tingling or new weaknesses Behavioral/Psych: Mood is stable, no new changes  All other systems were reviewed with the patient and are negative.  MEDICAL HISTORY:  Past Medical History:  Diagnosis Date  . Anemia    not current  . Cancer (HCC)    basal and squamous cell carcinoma    . Chronic interstitial cystitis 03/05/2014  . Diabetes mellitus without complication ([place])   . Hepatic cirrhosis ([place]) 05/08/2014  . Hyperlipidemia   . Hypertension   . Laryngopharyngeal reflux (LPR)   . Osteopenia   . Thrombocytopenia ([place]) 03/05/2014   platelet count runs low and very low at times    SURGICAL HISTORY: Past Surgical History:  Procedure Laterality Date  . BRAIN MENINGIOMA EXCISION    . COLONOSCOPY    . LAMINECTOMY    . PARTIAL HYSTERECTOMY     1993  . POLYPECTOMY     [name] in [place], [place]    I have reviewed the social history and family history with the patient and they are unchanged from previous note.  ALLERGIES:  is allergic to epinephrine; lidocaine; lisinopril; and penicillins.  MEDICATIONS:  Current Outpatient Medications  Medication Sig Dispense Refill  . Elderberry 575 MG/5ML SYRP     . Ferrous Sulfate Dried (HIGH POTENCY IRON) 65 MG TABS Take 1 tablet by mouth daily.    . irbesartan (AVAPRO) 75 MG tablet Take 75 mg by mouth daily.    . Wheat Dextrin (BENEFIBER) POWD Take by mouth.    . Calcium Carbonate-Vitamin D (CALCIUM-VITAMIN D) 600-125 MG-UNIT TABS Take by mouth daily.    . cholecalciferol (VITAMIN D) 400 UNITS TABS tablet Take 2,000 Units by mouth daily.    Marland Kitchen ELDERBERRY PO Take by mouth daily.    . nadolol (CORGARD) 20 MG tablet Take 1 tablet (20 mg total) by mouth daily. 90 tablet 3  . simvastatin (ZOCOR) 20 MG tablet Take 1 tablet by mouth daily.    . sitaGLIPtin-metformin (JANUMET) 50-1000 MG per tablet Take 1  tablet by mouth daily.      No current facility-administered medications for this visit.    PHYSICAL EXAMINATION: ECOG PERFORMANCE STATUS: 0 - Asymptomatic  Vitals:   06/01/19 1000  BP: (!) 154/62  Pulse: 66  Resp: 20  Temp: 98.3 F (36.8 C)  SpO2: 100%   Filed Weights   06/01/19 1000  Weight: 174 lb 3.2 oz (79 kg)    GENERAL:alert, no distress and comfortable SKIN: skin color, texture, turgor are normal, no  rashes or significant lesions EYES: normal, Conjunctiva are pink and non-injected, sclera clear  NECK: supple, thyroid normal size, non-tender, without nodularity LYMPH:  no palpable lymphadenopathy in the cervical, axillary  LUNGS: clear to auscultation and percussion with normal breathing effort HEART: regular rate & rhythm and no murmurs and no lower extremity edema ABDOMEN:abdomen soft, non-tender and normal bowel sounds. No palpable organomegaly  Musculoskeletal:no cyanosis of digits and no clubbing  NEURO: alert & oriented x 3 with fluent speech, no focal motor/sensory deficits  LABORATORY DATA:  I have reviewed the data as listed CBC Latest Ref Rng & Units 06/01/2019 11/24/2018 10/26/2018  WBC 4.0 - 10.5 K/uL 3.9(L) 3.7(L) 3.2(L)  Hemoglobin 12.0 - 15.0 g/dL 11.6(L) 11.2(L) 11.6(L)  Hematocrit 36.0 - 46.0 % 36.5 35.7(L) 34.7(L)  Platelets 150 - 400 K/uL 60(L) 54(L) 55.0(L)     CMP Latest Ref Rng & Units 11/24/2018 10/26/2018 05/26/2018  Glucose 70 - 99 mg/dL 133(H) 124(H) 128(H)  BUN 8 - 23 mg/dL 12 14 10   Creatinine 0.44 - 1.00 mg/dL 0.81 0.76 0.83  Sodium 135 - 145 mmol/L 141 143 143  Potassium 3.5 - 5.1 mmol/L 4.3 4.3 4.1  Chloride 98 - 111 mmol/L 105 108 106  CO2 22 - 32 mmol/L 27 26 25   Calcium 8.9 - 10.3 mg/dL 9.8 9.8 10.3  Total Protein 6.5 - 8.1 g/dL 6.8 7.0 7.2  Total Bilirubin 0.3 - 1.2 mg/dL 1.0 1.1 1.0  Alkaline Phos 38 - 126 U/L 79 73 91  AST 15 - 41 U/L 24 30 25   ALT 0 - 44 U/L 17 20 17       RADIOGRAPHIC STUDIES: I have personally reviewed the radiological images as listed and agreed with the findings in the report. No results found.   ASSESSMENT & PLAN:  Yvonne Carrillo is a 74 y.o. female with    1. 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 Bone marrow biopsy was normal. Work up revealed cirrhosis as the cause  of her splenomegaly and pancytopenia.  -There appears to be immune component of her thrombocytopenia, as it responded to steroids in the past. She is intolerant to prednisone.  -Consider dexamethasone for ITP flare if plt <30K -If platelets fall between 10-20K, or she has active bleeding we would consider plt transfusion -Consider possible administration of growth factor to stimulate platelet production if platelets fall between 30-50K before surgery or procedure.  -Will continue to monitor her at this time. Continue oral iron.  -She is clinically doing well. Labs reviewed, WBC 3.9, Hg 11.6, 60K. She denies recent overt bleeding or infection. Her AFP is still pending. Physical exam unremarkable  -f/u with Dr Henrene Pastor in 3 months and f/u with me in 6 months    2.Livercirrhosis, cryptogenic -she underwent very thorough work up by Dr. Beryle Beams which was essentially unremarkable, the cause for her liver cirrhosis remains unknown. -CT in 2016 showed esophageal  varices and findings consistent with portal venous hypertension -She underwent upper endoscopy on 10/26/2018 by Dr. Henrene Pastor which showed 2-3+ varices in the lower third esophagus moderate portal hypertensive gastropathy.  In the entire stomach..  The exam was otherwise. -She was started on nadolol and oral iron.  -10/2018 AFP WNL. Her 11/24/18 Abdomen US showed liver cirrhosis with evidence of portal HTN and splenomegaly. Repeat imaging every 6 months, she is due this month.  -Continue follow-up with Dr. Henrene Pastor   3. Multiple infections -from February through April, she has had recurrent infections including sinusitis, otitis media with effusions, and bronchitis. She has required multiple courses of antibiotics  -IgG was normal, no need for IVIG -she was treated for UTI in 04/2018 -f/u with GYN for cystocele; she recently underwent pessary placement in 07/2018 -She has not had infection recently. She has received both her COVID19 vaccines. I  strongly encouraged her to remain up to date on all her vaccines.    4. DM, HTN -Her sugar has been controlled, on Janumet -she will continue f/u with endocrine and PC, On nadolol    5. Cancer Screenings -I encouraged her to continue age appropriate cancer screenings.  -Last colonoscopy was 2018 and Endoscopy in 2020  -She continues to do Mammogram yearly in [place], [place]    PLAN:  -US abdomen this week  -she will repeat CBC and CMP at local [facility] in 3 months, I gave her paper orders  -Lab, f/u in 6 months with US abdomen in 6 months on same day  -I will send a message to Dr. Henrene Pastor to see her in 3 months    No problem-specific Assessment & Plan notes found for this encounter.   Orders Placed This Encounter  Procedures  . US Abdomen Complete    Standing Status:   Future    Standing Expiration Date:   05/31/2020    Scheduling Instructions:     Today or anytime in the next month    Order Specific Question:   Reason for Exam (SYMPTOM  OR DIAGNOSIS REQUIRED)    Answer:   evaluate liver and spleen    Order Specific Question:   Preferred imaging location?    Answer:   [facility]    Order Specific Question:   Release to patient    Answer:   Immediate  . US Abdomen Complete    Standing Status:   Future    Standing Expiration Date:   05/31/2020    Order Specific Question:   Reason for Exam (SYMPTOM  OR DIAGNOSIS REQUIRED)    Answer:   evaluate liver and spleen    Order Specific Question:   Preferred imaging location?    Answer:   [facility]    Order Specific Question:   Release to patient    Answer:   Immediate   All questions were answered. The patient knows to call the clinic with any problems, questions or concerns. No barriers to learning was detected. The total time spent in the appointment was 25 minutes.     Truitt Merle, MD 06/01/2019   I, Joslyn Devon, am acting as scribe for Truitt Merle, MD.   I have reviewed the above documentation for accuracy  and completeness, and I agree with the above.

## 2019-06-01 ENCOUNTER — Other Ambulatory Visit: Payer: Self-pay

## 2019-06-01 ENCOUNTER — Inpatient Hospital Stay: Payer: Medicare Other | Admitting: Hematology

## 2019-06-01 ENCOUNTER — Encounter: Payer: Self-pay | Admitting: Hematology

## 2019-06-01 ENCOUNTER — Inpatient Hospital Stay: Payer: Medicare Other | Attending: Hematology

## 2019-06-01 VITALS — BP 154/62 | HR 66 | Temp 98.3°F | Resp 20 | Ht 64.75 in | Wt 174.2 lb

## 2019-06-01 DIAGNOSIS — Z7984 Long term (current) use of oral hypoglycemic drugs: Secondary | ICD-10-CM | POA: Diagnosis not present

## 2019-06-01 DIAGNOSIS — K7469 Other cirrhosis of liver: Secondary | ICD-10-CM

## 2019-06-01 DIAGNOSIS — N8110 Cystocele, unspecified: Secondary | ICD-10-CM | POA: Diagnosis not present

## 2019-06-01 DIAGNOSIS — K3189 Other diseases of stomach and duodenum: Secondary | ICD-10-CM | POA: Insufficient documentation

## 2019-06-01 DIAGNOSIS — D61818 Other pancytopenia: Secondary | ICD-10-CM | POA: Diagnosis present

## 2019-06-01 DIAGNOSIS — R161 Splenomegaly, not elsewhere classified: Secondary | ICD-10-CM | POA: Diagnosis not present

## 2019-06-01 DIAGNOSIS — K766 Portal hypertension: Secondary | ICD-10-CM | POA: Insufficient documentation

## 2019-06-01 DIAGNOSIS — I10 Essential (primary) hypertension: Secondary | ICD-10-CM | POA: Diagnosis not present

## 2019-06-01 DIAGNOSIS — I8500 Esophageal varices without bleeding: Secondary | ICD-10-CM | POA: Diagnosis not present

## 2019-06-01 DIAGNOSIS — Z90710 Acquired absence of both cervix and uterus: Secondary | ICD-10-CM | POA: Diagnosis not present

## 2019-06-01 DIAGNOSIS — K7460 Unspecified cirrhosis of liver: Secondary | ICD-10-CM | POA: Diagnosis not present

## 2019-06-01 DIAGNOSIS — E119 Type 2 diabetes mellitus without complications: Secondary | ICD-10-CM | POA: Diagnosis not present

## 2019-06-01 DIAGNOSIS — Z8619 Personal history of other infectious and parasitic diseases: Secondary | ICD-10-CM | POA: Insufficient documentation

## 2019-06-01 LAB — CBC WITH DIFFERENTIAL (CANCER CENTER ONLY)
Abs Immature Granulocytes: 0.01 10*3/uL (ref 0.00–0.07)
Basophils Absolute: 0 10*3/uL (ref 0.0–0.1)
Basophils Relative: 0 %
Eosinophils Absolute: 0.1 10*3/uL (ref 0.0–0.5)
Eosinophils Relative: 2 %
HCT: 36.5 % (ref 36.0–46.0)
Hemoglobin: 11.6 g/dL — ABNORMAL LOW (ref 12.0–15.0)
Immature Granulocytes: 0 %
Lymphocytes Relative: 21 %
Lymphs Abs: 0.8 10*3/uL (ref 0.7–4.0)
MCH: 28.9 pg (ref 26.0–34.0)
MCHC: 31.8 g/dL (ref 30.0–36.0)
MCV: 91 fL (ref 80.0–100.0)
Monocytes Absolute: 0.6 10*3/uL (ref 0.1–1.0)
Monocytes Relative: 15 %
Neutro Abs: 2.4 10*3/uL (ref 1.7–7.7)
Neutrophils Relative %: 62 %
Platelet Count: 60 10*3/uL — ABNORMAL LOW (ref 150–400)
RBC: 4.01 MIL/uL (ref 3.87–5.11)
RDW: 13.2 % (ref 11.5–15.5)
WBC Count: 3.9 10*3/uL — ABNORMAL LOW (ref 4.0–10.5)
nRBC: 0 % (ref 0.0–0.2)

## 2019-06-02 ENCOUNTER — Telehealth: Payer: Self-pay | Admitting: Hematology

## 2019-06-02 LAB — AFP TUMOR MARKER: AFP, Serum, Tumor Marker: 1.1 ng/mL (ref 0.0–8.3)

## 2019-06-02 NOTE — Telephone Encounter (Signed)
Scheduled appt per 5/6 los. ° °A calendar will be mailed out. °

## 2019-06-13 ENCOUNTER — Ambulatory Visit (HOSPITAL_COMMUNITY)
Admission: RE | Admit: 2019-06-13 | Discharge: 2019-06-13 | Disposition: A | Discharge status: Home / Self Care | Payer: Medicare Other | Source: Ambulatory Visit | Attending: Hematology | Admitting: Hematology

## 2019-06-13 ENCOUNTER — Other Ambulatory Visit: Payer: Self-pay

## 2019-06-13 DIAGNOSIS — K7469 Other cirrhosis of liver: Secondary | ICD-10-CM | POA: Insufficient documentation

## 2019-06-27 ENCOUNTER — Telehealth: Payer: Self-pay

## 2019-06-27 NOTE — Telephone Encounter (Signed)
TC to pt per Dr Burr Medico to let her know her  [place] result, I let her know that it showed known liver cirrhosis, no liver lesion, no concerns for now. Patient verbalized understanding.

## 2019-07-20 ENCOUNTER — Encounter: Payer: Self-pay | Admitting: Hematology

## 2019-11-05 ENCOUNTER — Encounter: Payer: Self-pay | Admitting: Nurse Practitioner

## 2019-11-15 ENCOUNTER — Encounter: Payer: Self-pay | Admitting: Hematology

## 2019-11-28 ENCOUNTER — Ambulatory Visit (HOSPITAL_COMMUNITY)
Admission: RE | Admit: 2019-11-28 | Discharge: 2019-11-28 | Disposition: A | Discharge status: Home / Self Care | Payer: Medicare Other | Source: Ambulatory Visit | Attending: Hematology | Admitting: Hematology

## 2019-11-28 ENCOUNTER — Other Ambulatory Visit: Payer: Self-pay

## 2019-11-28 ENCOUNTER — Ambulatory Visit: Payer: Medicare Other | Admitting: Internal Medicine

## 2019-11-28 ENCOUNTER — Other Ambulatory Visit (INDEPENDENT_AMBULATORY_CARE_PROVIDER_SITE_OTHER): Payer: Medicare Other

## 2019-11-28 ENCOUNTER — Encounter: Payer: Self-pay | Admitting: Internal Medicine

## 2019-11-28 VITALS — BP 112/70 | HR 60 | Ht 64.75 in | Wt 170.0 lb

## 2019-11-28 DIAGNOSIS — K7469 Other cirrhosis of liver: Secondary | ICD-10-CM

## 2019-11-28 DIAGNOSIS — K766 Portal hypertension: Secondary | ICD-10-CM

## 2019-11-28 DIAGNOSIS — R140 Abdominal distension (gaseous): Secondary | ICD-10-CM | POA: Diagnosis not present

## 2019-11-28 DIAGNOSIS — I8500 Esophageal varices without bleeding: Secondary | ICD-10-CM

## 2019-11-28 LAB — PROTIME-INR
INR: 1.2 ratio — ABNORMAL HIGH (ref 0.8–1.0)
Prothrombin Time: 13.4 s — ABNORMAL HIGH (ref 9.6–13.1)

## 2019-11-28 NOTE — Patient Instructions (Signed)
Your provider has requested that you go to the basement level for lab work before leaving today. Press "B" on the elevator. The lab is located at the first door on the left as you exit the elevator.   Due to recent changes in healthcare laws, you may see the results of your imaging and laboratory studies on MyChart before your provider has had a chance to review them.  We understand that in some cases there may be results that are confusing or concerning to you. Not all laboratory results come back in the same time frame and the provider may be waiting for multiple results in order to interpret others.  Please give [place] 48 hours in order for your provider to thoroughly review all the results before contacting the office for clarification of your results.

## 2019-11-28 NOTE — Progress Notes (Signed)
HISTORY OF PRESENT ILLNESS:  Yvonne Carrillo is a 74 y.o. female, former radiology coordinator from [place], with a history of cryptogenic cirrhosis worked up extensively elsewhere.  She has been followed by hematology/oncology.  Patient presents for routine annual follow-up regarding her cirrhosis.  She was last seen November 2020 she is known to have at that time she was compensated with a MELD score of 10.  She is known to have portal hypertension with thrombocytopenia, splenomegaly, portal hypertensive gastropathy, and esophageal varices.  She is on chronic nadolol and iron therapy.  She also has a history of adenomatous colon polyps surveillance up-to-date.  Since her last visit, she has done well.  She continues on nadolol and iron therapy.  She brings with her chart of her heart rates and blood pressures.  Blood pressures typically run around 120/60.  Heart rate typically runs between 50 and 60.  Review of outside blood work from October 27, 2019 shows normal hemoglobin of 11.5.  MCV 86.  Platelet count 67.  Normal liver tests with bilirubin 0.9.  Normal albumin 4.5.  Normal renal function.  Last colonoscopy was 2018.  Adenomatous polyps.  Follow-up in 5 years recommended.  She does report mild constipation for which he takes Metamucil.  This helps.  Also occasional gas bloat stools.  Next, she asked me about "silent reflux".  This diagnosis was suggested by her ENT physician for chief complaint of mucus in her throat.  She was treated with Gaviscon without improvement.  She has been having trouble with back pain.  She inquires as to what analgesics she might take which would be safe for her liver.  She did have her 61-month repeat ultrasound earlier today.  Results pending she has completed her Covid vaccination series and booster.  She has multiple questions.Marland Kitchen  REVIEW OF SYSTEMS:  All non-GI ROS negative except as otherwise stated in the HPI for urinary frequency, back pain  Past Medical  History:  Diagnosis Date   Anemia    not current   Cancer ([place])    basal and squamous cell carcinoma   Chronic interstitial cystitis 03/05/2014   Diabetes mellitus without complication ([facility])    Hepatic cirrhosis ([place]) 05/08/2014   Hyperlipidemia    Hypertension    Laryngopharyngeal reflux (LPR)    Osteopenia    Thrombocytopenia ([place]) 03/05/2014   platelet count runs low and very low at times    Past Surgical History:  Procedure Laterality Date   [facility] in [place], [place]  reports that she has never smoked. She has never used smokeless tobacco. She reports that she does not drink alcohol and does not use drugs.  family history includes COPD in her mother; Heart disease in her father.  Allergies  Allergen Reactions   Epinephrine Other (See Comments)    Increase heart rate   Lidocaine    Lisinopril Cough   Penicillins Rash       PHYSICAL EXAMINATION: Vital signs: BP 112/70    Pulse 60    Ht 5' 4.75" (1.645 m)    Wt 170 lb (77.1 kg)    SpO2 99%    BMI 28.51 kg/m   Constitutional: generally well-appearing, no acute distress Psychiatric: alert and oriented x3, cooperative Eyes: extraocular movements intact, anicteric, conjunctiva pink  Mouth: oral pharynx moist, no lesions Neck: supple no lymphadenopathy Cardiovascular: heart regular rate and rhythm, no murmur Lungs: clear to auscultation bilaterally Abdomen: soft, nontender, nondistended, no obvious ascites, no peritoneal signs, normal bowel sounds, no organomegaly Rectal: Omitted Extremities: no clubbing, cyanosis, or lower extremity edema bilaterally Skin: no lesions on visible extremities Neuro: No focal deficits. No asterixis.    ASSESSMENT:  1.  Cryptogenic cirrhosis.  Compensated.  Meld 10 2.  Portal hypertension with thrombocytopenia, splenomegaly,  portal hypertensive gastropathy, and esophageal varices.  Remains on nadolol and iron.  Acceptable blood pressure and heart rate.  Normal hemoglobin. 3.  History of adenomatous colon polyps.  Surveillance up-to-date 4.  Question silent reflux. 5.  Constipation, mild. 6.  Intermittent problems with bloating and gas  PLAN:  1.  Check alpha-fetoprotein and PT/INR today 2.  Follow-up on abdominal ultrasound from earlier today 3.  Check tissue transglutaminase antibody for intermittent problems with bloating and gas 4.  Routine office follow-up 1 year 5.  Surveillance colonoscopy around 2023 6.  Okay to take analgesics such as Tylenol or NSAIDs and usual doses for short periods of time, as needed. A total time of 30 minutes was spent preparing to see the patient, reviewing outside tests and records, obtaining comprehensive interval history, performing comprehensive physical examination, counseling the patient regarding the above listed issues, ordering laboratory tests, recommending follow-up interval, documenting clinical information in health record

## 2019-11-29 LAB — AFP TUMOR MARKER: AFP-Tumor Marker: 1.5 ng/mL

## 2019-11-29 LAB — TISSUE TRANSGLUTAMINASE, IGA: (tTG) Ab, IgA: <1 U/mL

## 2019-12-01 NOTE — Progress Notes (Signed)
Yvonne Carrillo   [phone] Fax:(336) [phone]   Clinic Follow up Note   Patient Care Team: Tobe Sos, MD as PCP - General (Internal Medicine) Annia Belt, MD as Consulting Physician (Oncology) Altheimer, Legrand Como, MD as Consulting Physician (Endocrinology) Carol Ada, MD as Consulting Physician (Gastroenterology) Irene Shipper, MD as Consulting Physician (Gastroenterology) Truitt Merle, MD as Consulting Physician (Hematology) Alla Feeling, NP as Nurse Practitioner (Nurse Practitioner)   I connected with Yvonne Carrillo on 12/04/2019 at 11:20 AM EST by telephone visit and verified that I am speaking with the correct person using two identifiers.  I discussed the limitations, risks, security and privacy concerns of performing an evaluation and management service by telephone and the availability of in person appointments. I also discussed with the patient that there may be a patient responsible charge related to this service. The patient expressed understanding and agreed to proceed.   Other persons participating in the visit and their role in the encounter:  None  Patient's location:  Her home Provider's location:  My office  CHIEF COMPLAINT: F/u pancytopenia  CURRENT THERAPY:  Oral iron once daily starting 11/03/19 per GI    INTERVAL HISTORY:  Yvonne Carrillo is here for a follow up of pancytopenia. She notes she has significant low back pain lately. She notes she has seen her PCP about this who started her on PT. He also offered cortisone shot and pain medication, but she declined. She is taking Tylenol for now. She will see her PCP in 12/2019.     REVIEW OF SYSTEMS:   Constitutional: Denies fevers, chills or abnormal weight loss Eyes: Denies blurriness of vision Ears, nose, mouth, throat, and face: Denies mucositis or sore throat Respiratory: Denies cough, dyspnea or wheezes Cardiovascular: Denies palpitation, chest discomfort or lower  extremity swelling Gastrointestinal:  Denies nausea, heartburn or change in bowel habits Skin: Denies abnormal skin rashes MSK: (+) Low back pain  Lymphatics: Denies new lymphadenopathy or easy bruising Neurological:Denies numbness, tingling or new weaknesses Behavioral/Psych: Mood is stable, no new changes  All other systems were reviewed with the patient and are negative.  MEDICAL HISTORY:  Past Medical History:  Diagnosis Date  . Anemia    not current  . Cancer (HCC)    basal and squamous cell carcinoma  . Chronic interstitial cystitis 03/05/2014  . Diabetes mellitus without complication ([place])   . Hepatic cirrhosis ([place]) 05/08/2014  . Hyperlipidemia   . Hypertension   . Laryngopharyngeal reflux (LPR)   . Osteopenia   . Thrombocytopenia ([place]) 03/05/2014   platelet count runs low and very low at times    SURGICAL HISTORY: Past Surgical History:  Procedure Laterality Date  . BRAIN MENINGIOMA EXCISION    . COLONOSCOPY    . LAMINECTOMY    . PARTIAL HYSTERECTOMY     1993  . POLYPECTOMY     [name] in [place], [place]    I have reviewed the social history and family history with the patient and they are unchanged from previous note.  ALLERGIES:  is allergic to epinephrine, lidocaine, lisinopril, and penicillins.  MEDICATIONS:  Current Outpatient Medications  Medication Sig Dispense Refill  . Al Hyd-Mg Tr-Alg Ac-Sod Bicarb (GAVISCON-2 PO) Take 2 tablets by mouth daily.    . Calcium Carbonate-Vitamin D (CALCIUM-VITAMIN D) 600-125 MG-UNIT TABS Take by mouth daily.    . cholecalciferol (VITAMIN D) 400 UNITS TABS tablet Take 2,000 Units by mouth daily.    Marland Kitchen ELDERBERRY PO Take by  mouth daily.    . Ferrous Sulfate Dried (HIGH POTENCY IRON) 65 MG TABS Take 1 tablet by mouth daily.    . irbesartan (AVAPRO) 75 MG tablet Take 75 mg by mouth daily.    . mometasone (ELOCON) 0.1 % lotion Apply topically.    . nadolol (CORGARD) 20 MG tablet Take 1 tablet (20 mg total) by mouth daily. 90  tablet 3  . psyllium (METAMUCIL) 58.6 % powder Take 1 packet by mouth as needed. 3 times a week as needed    . simvastatin (ZOCOR) 20 MG tablet Take 1 tablet by mouth daily.    . sitaGLIPtin-metformin (JANUMET) 50-1000 MG per tablet Take 1 tablet by mouth daily.      No current facility-administered medications for this visit.    PHYSICAL EXAMINATION: ECOG PERFORMANCE STATUS: 1 - Symptomatic but completely ambulatory  No vitals taken today, Exam not performed today   LABORATORY DATA:  I have reviewed the data as listed CBC Latest Ref Rng & Units 06/01/2019 11/24/2018 10/26/2018  WBC 4.0 - 10.5 K/uL 3.9(L) 3.7(L) 3.2(L)  Hemoglobin 12.0 - 15.0 g/dL 11.6(L) 11.2(L) 11.6(L)  Hematocrit 36 - 46 % 36.5 35.7(L) 34.7(L)  Platelets 150 - 400 K/uL 60(L) 54(L) 55.0(L)     CMP Latest Ref Rng & Units 11/24/2018 10/26/2018 05/26/2018  Glucose 70 - 99 mg/dL 133(H) 124(H) 128(H)  BUN 8 - 23 mg/dL _0 Creatinine 0.44 - 1.00 mg/dL 0.81 0.76 0.83  Sodium 135 - 145 mmol/L 141 143 143  Potassium 3.5 - 5.1 mmol/L 4.3 4.3 4.1  Chloride 98 - 111 mmol/L 105 108 106  CO2 22 - 32 mmol/L _1 Calcium 8.9 - 10.3 mg/dL 9.8 9.8 10.3  Total Protein 6.5 - 8.1 g/dL 6.8 7.0 7.2  Total Bilirubin 0.3 - 1.2 mg/dL 1.0 1.1 1.0  Alkaline Phos 38 - 126 U/L 79 73 91  AST 15 - 41 U/L _2 ALT 0 - 44 U/L _3 RADIOGRAPHIC STUDIES: I have personally reviewed the radiological images as listed and agreed with the findings in the report. No results found.   ASSESSMENT & PLAN:  Yvonne Carrillo is a 74 y.o. female with    1. Pancytopenia, predominantly thrombocytopenia, secondary to liver cirrhosis, and component of ITP -She had mild intermittent anemia and leukopenia in the past, and chronic thrombocytopenia. Bone marrow biopsy was normal. Work up revealed cirrhosis as the cause of her splenomegaly and pancytopenia.  -There appears to be immune component of her thrombocytopenia, as it responded  to steroids in the past. She is intolerant to prednisone. -Consider dexamethasone for ITP flare if plt <30K -If platelets fall between 10-20K, or she has active bleeding we would consider plt transfusion -Considerpossible administration of growth factor to stimulate platelet production if platelets fall between 30-50K before surgery or procedure.  -She is clinically doing well. We reviewed her 10/2019 labs which show WBC 4, Hg 11.5, plt 67K, ANC2.5. She is overall stable.  -Will continue to monitor her at this time. Continue oral iron.  -Lab and [place] in 6 months with follow up. Then I will see her yearly.    2.Livercirrhosis, cryptogenic -She underwent very thorough work up by Dr. Beryle Beams which was essentially unremarkable, the cause for her liver cirrhosis remains unknown. 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 In the entire stomach. She was started on nadolol andoral iron.  -Her 11/28/19 [place] was NED and splenomegaly. Continue to repeat imaging every 6 months. -Continue follow-up with Dr.Perry   3. Multiple infections -from February through April, she has had recurrent infections including sinusitis, otitis media with effusions, and bronchitis. She has required multiple courses of antibiotics  -IgG was normal, no need for IVIG -She was treated for UTI in 04/2018 -f/u with GYN for cystocele; she recently underwent pessary placement in 07/2018 -She has not had infection recently. She has received both her COVID19 vaccines. I strongly encouraged her to remain up to date on all her vaccines.    4. DM, HTN, Cancer Screenings -Her sugar has been controlled, on Janumet -she will continue f/u with endocrine and PC, On nadolol  -I encouraged her to continue age appropriate cancer  screenings.  -Last colonoscopy was 2018 and Endoscopy in 2020  -She continues to do Mammogram yearly in [place], [place]   5. Low back pain  -Recent onset with significant pain. Pt notes she has had this before.  -She was seen by PCP who offered pain medication and cortisone shot but she declined. She opted to do PT instead. Will continue.  -For pain she can continue to take Tylenol, but limited use due to liver cirrhosis.    PLAN:  -Lab, US abdomen and F/u in 6 months    No problem-specific Assessment & Plan notes found for this encounter.   Orders Placed This Encounter  Procedures  . US Abdomen Complete    Standing Status:   Future    Standing Expiration Date:   12/03/2020    Order Specific Question:   Reason for Exam (SYMPTOM  OR DIAGNOSIS REQUIRED)    Answer:   screening    Order Specific Question:   Preferred imaging location?    Answer:   [facility]   I discussed the assessment and treatment plan with the patient. The patient was provided an opportunity to ask questions and all were answered. The patient agreed with the plan and demonstrated an understanding of the instructions.  The patient was advised to call back or seek an in-person evaluation if the symptoms worsen or if the condition fails to improve as anticipated.  The total time spent in the appointment was 15 minutes.    Truitt Merle, MD 12/04/2019   I, Joslyn Devon, am acting as scribe for Truitt Merle, MD.   I have reviewed the above documentation for accuracy and completeness, and I agree with the above.

## 2019-12-03 DIAGNOSIS — D61818 Other pancytopenia: Secondary | ICD-10-CM | POA: Insufficient documentation

## 2019-12-04 ENCOUNTER — Encounter: Payer: Self-pay | Admitting: Hematology

## 2019-12-04 ENCOUNTER — Inpatient Hospital Stay: Payer: Medicare Other | Attending: Hematology | Admitting: Hematology

## 2019-12-04 ENCOUNTER — Other Ambulatory Visit: Payer: Medicare Other

## 2019-12-04 DIAGNOSIS — D61818 Other pancytopenia: Secondary | ICD-10-CM | POA: Diagnosis not present

## 2019-12-04 DIAGNOSIS — K7469 Other cirrhosis of liver: Secondary | ICD-10-CM | POA: Diagnosis not present

## 2019-12-06 ENCOUNTER — Telehealth: Payer: Self-pay | Admitting: Hematology

## 2019-12-06 NOTE — Telephone Encounter (Signed)
Scheduled per 11/8 los. Pt is aware of appt times and date.

## 2019-12-18 ENCOUNTER — Emergency Department (HOSPITAL_COMMUNITY)
Admission: EM | Admit: 2019-12-18 | Discharge: 2019-12-18 | Disposition: A | Discharge status: Home / Self Care | Payer: Medicare Other

## 2019-12-18 ENCOUNTER — Other Ambulatory Visit: Payer: Self-pay

## 2019-12-18 NOTE — ED Notes (Signed)
Patient came out of the lobby bathroom and stated she was not coming into the ED. Patient stated, "I had an impaction and I just pooped so I am not coming in."

## 2019-12-18 NOTE — ED Notes (Signed)
I called patient to be triage and she is in the restroom

## 2020-05-29 NOTE — Progress Notes (Incomplete)
[facility]   Telephone:(336) [phone] Fax:(336) [phone]   Clinic Follow up Note   Patient Care Team: Tobe Sos, MD as PCP - General (Internal Medicine) Annia Belt, MD as Consulting Physician (Oncology) Altheimer, Legrand Como, MD as Consulting Physician (Endocrinology) Carol Ada, MD as Consulting Physician (Gastroenterology) Irene Shipper, MD as Consulting Physician (Gastroenterology) Truitt Merle, MD as Consulting Physician (Hematology) Alla Feeling, NP as Nurse Practitioner (Nurse Practitioner)  Date of Service:  05/29/2020  CHIEF COMPLAINT: F/u pancytopenia   CURRENT THERAPY:  Oral iron once daily starting 11/03/19 per GI  INTERVAL HISTORY: *** Yvonne Carrillo is here for a follow up of pancytopenia. She was last seen by me 6 months ago. She presents to the clinic alone.    REVIEW OF SYSTEMS:  *** Constitutional: Denies fevers, chills or abnormal weight loss Eyes: Denies blurriness of vision Ears, nose, mouth, throat, and face: Denies mucositis or sore throat Respiratory: Denies cough, dyspnea or wheezes Cardiovascular: Denies palpitation, chest discomfort or lower extremity swelling Gastrointestinal:  Denies nausea, heartburn or change in bowel habits Skin: Denies abnormal skin rashes Lymphatics: Denies new lymphadenopathy or easy bruising Neurological:Denies numbness, tingling or new weaknesses Behavioral/Psych: Mood is stable, no new changes  All other systems were reviewed with the patient and are negative.  MEDICAL HISTORY:  Past Medical History:  Diagnosis Date  . Anemia    not current  . Cancer (HCC)    basal and squamous cell carcinoma  . Chronic interstitial cystitis 03/05/2014  . Diabetes mellitus without complication ([place])   . Hepatic cirrhosis ([place]) 05/08/2014  . Hyperlipidemia   . Hypertension   . Laryngopharyngeal reflux (LPR)   . Osteopenia   . Thrombocytopenia ([place]) 03/05/2014   platelet count runs low and very low  at times    SURGICAL HISTORY: Past Surgical History:  Procedure Laterality Date  . BRAIN MENINGIOMA EXCISION    . COLONOSCOPY    . LAMINECTOMY    . PARTIAL HYSTERECTOMY     1993  . POLYPECTOMY     [name] in [place], [place]    I have reviewed the social history and family history with the patient and they are unchanged from previous note.  ALLERGIES:  is allergic to epinephrine, lidocaine, lisinopril, and penicillins.  MEDICATIONS:  Current Outpatient Medications  Medication Sig Dispense Refill  . Al Hyd-Mg Tr-Alg Ac-Sod Bicarb (GAVISCON-2 PO) Take 2 tablets by mouth daily.    . Calcium Carbonate-Vitamin D (CALCIUM-VITAMIN D) 600-125 MG-UNIT TABS Take by mouth daily.    . cholecalciferol (VITAMIN D) 400 UNITS TABS tablet Take 2,000 Units by mouth daily.    Marland Kitchen ELDERBERRY PO Take by mouth daily.    . Ferrous Sulfate Dried (HIGH POTENCY IRON) 65 MG TABS Take 1 tablet by mouth daily.    . irbesartan (AVAPRO) 75 MG tablet Take 75 mg by mouth daily.    . mometasone (ELOCON) 0.1 % lotion Apply topically.    . nadolol (CORGARD) 20 MG tablet Take 1 tablet (20 mg total) by mouth daily. 90 tablet 3  . psyllium (METAMUCIL) 58.6 % powder Take 1 packet by mouth as needed. 3 times a week as needed    . simvastatin (ZOCOR) 20 MG tablet Take 1 tablet by mouth daily.    . sitaGLIPtin-metformin (JANUMET) 50-1000 MG per tablet Take 1 tablet by mouth daily.      No current facility-administered medications for this visit.    PHYSICAL EXAMINATION: ECOG PERFORMANCE STATUS: {CHL ONC ECOG  ZO:[id number]}  There were no vitals filed for this visit. There were no vitals filed for this visit. *** GENERAL:alert, no distress and comfortable SKIN: skin color, texture, turgor are normal, no rashes or significant lesions EYES: normal, Conjunctiva are pink and non-injected, sclera clear {OROPHARYNX:no exudate, no erythema and lips, buccal mucosa, and tongue normal}  NECK: supple, thyroid normal size,  non-tender, without nodularity LYMPH:  no palpable lymphadenopathy in the cervical, axillary {or inguinal} LUNGS: clear to auscultation and percussion with normal breathing effort HEART: regular rate & rhythm and no murmurs and no lower extremity edema ABDOMEN:abdomen soft, non-tender and normal bowel sounds Musculoskeletal:no cyanosis of digits and no clubbing  NEURO: alert & oriented x 3 with fluent speech, no focal motor/sensory deficits  LABORATORY DATA:  I have reviewed the data as listed CBC Latest Ref Rng & Units 06/01/2019 11/24/2018 10/26/2018  WBC 4.0 - 10.5 K/uL 3.9(L) 3.7(L) 3.2(L)  Hemoglobin 12.0 - 15.0 g/dL 11.6(L) 11.2(L) 11.6(L)  Hematocrit 36.0 - 46.0 % 36.5 35.7(L) 34.7(L)  Platelets 150 - 400 K/uL 60(L) 54(L) 55.0(L)     CMP Latest Ref Rng & Units 11/24/2018 10/26/2018 05/26/2018  Glucose 70 - 99 mg/dL 133(H) 124(H) 128(H)  BUN 8 - 23 mg/dL 12 14 10   Creatinine 0.44 - 1.00 mg/dL 0.81 0.76 0.83  Sodium 135 - 145 mmol/L 141 143 143  Potassium 3.5 - 5.1 mmol/L 4.3 4.3 4.1  Chloride 98 - 111 mmol/L 105 108 106  CO2 22 - 32 mmol/L 27 26 25   Calcium 8.9 - 10.3 mg/dL 9.8 9.8 10.3  Total Protein 6.5 - 8.1 g/dL 6.8 7.0 7.2  Total Bilirubin 0.3 - 1.2 mg/dL 1.0 1.1 1.0  Alkaline Phos 38 - 126 U/L 79 73 91  AST 15 - 41 U/L 24 30 25   ALT 0 - 44 U/L 17 20 17       RADIOGRAPHIC STUDIES: I have personally reviewed the radiological images as listed and agreed with the findings in the report. No results found.   ASSESSMENT & PLAN:  Yvonne Carrillo is a 75 y.o. female with    1. Pancytopenia, predominantly thrombocytopenia, secondary to liver cirrhosis, and component of ITP -She hadmild intermittent anemia and leukopenia in the past, and chronic thrombocytopenia. Bone marrow biopsy was normal. Work up revealed cirrhosis as the cause of her splenomegaly and pancytopenia.  -There appears tobe immune component of her thrombocytopenia, as it responded to steroids in the past. She  is intolerant to prednisone. -Consider dexamethasone for ITP flare if plt <30K -If platelets fall between 10-20K, or she has active bleeding we would consider plt transfusion -Considerpossible administration of growth factor to stimulate platelet production if platelets fall between 30-50K before surgery or procedure. -She is clinically doing well. We reviewed her 10/2019 labs which show WBC 4, Hg 11.5, plt 67K, ANC2.5. She is overall stable.  -Will continue to monitor her at this time. Continue oral iron. -Lab and [place] in 6 months with follow up. Then I will see her yearly.    2.Livercirrhosis, cryptogenic -She underwent very thorough work up by Dr. Beryle Beams which was essentially unremarkable, the cause for her liver cirrhosis remains unknown. 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 In the entire stomach. She was started on nadolol andoral iron. -Her 11/28/19 [place] was NED and splenomegaly. Continue to repeat imaging every 6  months. -Continue follow-up with Dr.Perry   3. Multiple infections -from February through April, she has had recurrent infections including sinusitis, otitis media with effusions, and bronchitis. She has required multiple courses of antibiotics  -IgG was normal, no need for IVIG -She was treated for UTI in 04/2018 -f/u with GYN for cystocele; she recently underwent pessary placement in 07/2018 -She has not had infection recently. She has received both her COVID19 vaccines. I strongly encouraged her to remain up to date on all her vaccines.   4. DM, HTN, Cancer Screenings -Her sugar has been controlled, on Janumet -she will continue f/u with endocrine and PC, Onnadolol  -I encouraged her to continue age appropriate cancer screenings.  -Last  colonoscopy was 2018 and Endoscopy in 2020  -She continues to do Mammogram yearly in [place], [place]  5. Low back pain  -Recent onset with significant pain. Pt notes she has had this before.  -She was seen by PCP who offered pain medication and cortisone shot but she declined. She opted to do PT instead. Will continue.  -For pain she can continue to take Tylenol, but limited use due to liver cirrhosis.    PLAN: -Lab, US abdomen and F/u in 6 months    No problem-specific Assessment & Plan notes found for this encounter.   No orders of the defined types were placed in this encounter.  All questions were answered. The patient knows to call the clinic with any problems, questions or concerns. No barriers to learning was detected. The total time spent in the appointment was {CHL ONC TIME VISIT - DTHYH:[id number]}.     Joslyn Devon 05/29/2020   Oneal Deputy, am acting as scribe for Truitt Merle, MD.   {Add scribe attestation statement}

## 2020-06-03 ENCOUNTER — Ambulatory Visit (HOSPITAL_COMMUNITY): Payer: Medicare Other

## 2020-06-03 ENCOUNTER — Ambulatory Visit: Payer: Medicare Other | Admitting: Hematology

## 2020-06-03 ENCOUNTER — Other Ambulatory Visit: Payer: Medicare Other

## 2020-07-10 ENCOUNTER — Other Ambulatory Visit: Payer: Self-pay

## 2020-07-10 DIAGNOSIS — D61818 Other pancytopenia: Secondary | ICD-10-CM

## 2020-07-10 NOTE — Progress Notes (Signed)
[place]   Telephone:(336) [phone] Fax:([phone]   Clinic Follow up Note   Patient Care Team: Tobe Sos, MD as PCP - General (Internal Medicine) Annia Belt, MD as Consulting Physician (Oncology) Altheimer, Legrand Como, MD as Consulting Physician (Endocrinology) Carol Ada, MD as Consulting Physician (Gastroenterology) Irene Shipper, MD as Consulting Physician (Gastroenterology) Truitt Merle, MD as Consulting Physician (Hematology) Alla Feeling, NP as Nurse Practitioner (Nurse Practitioner)  Date of Service:  07/11/2020  CHIEF COMPLAINT: f/u of pancytopenia   CURRENT THERAPY:  Oral iron once daily starting 11/03/19 per GI  INTERVAL HISTORY:  Yvonne Carrillo is here for a follow up of pancytopenia. She was last seen by me on 12/04/19. She presents to the clinic alone. She denies any bleeding or bruising.   All other systems were reviewed with the patient and are negative.  MEDICAL HISTORY:  Past Medical History:  Diagnosis Date   Anemia    not current   Cancer ([place])    basal and squamous cell carcinoma   Chronic interstitial cystitis 03/05/2014   Diabetes mellitus without complication ([place])    Hepatic cirrhosis ([place]) 05/08/2014   Hyperlipidemia    Hypertension    Laryngopharyngeal reflux (LPR)    Osteopenia    Thrombocytopenia ([place]) 03/05/2014   platelet count runs low and very low at times    SURGICAL HISTORY: Past Surgical History:  Procedure Laterality Date   BRAIN MENINGIOMA EXCISION     COLONOSCOPY     LAMINECTOMY     PARTIAL HYSTERECTOMY     1993   POLYPECTOMY     [name] in [place], [place]    I have reviewed the social history and family history with the patient and they are unchanged from previous note.  ALLERGIES:  is allergic to epinephrine, lidocaine, lisinopril, and penicillins.  MEDICATIONS:  Current Outpatient Medications  Medication Sig Dispense Refill   mometasone (ELOCON) 0.1 % lotion Apply topically.      Al Hyd-Mg Tr-Alg Ac-Sod Bicarb (GAVISCON-2 PO) Take 2 tablets by mouth daily.     Calcium Carbonate-Vitamin D (CALCIUM-VITAMIN D) 600-125 MG-UNIT TABS Take by mouth daily.     cholecalciferol (VITAMIN D) 400 UNITS TABS tablet Take 2,000 Units by mouth daily.     Ferrous Sulfate Dried (HIGH POTENCY IRON) 65 MG TABS Take 1 tablet by mouth daily.     irbesartan (AVAPRO) 75 MG tablet Take 75 mg by mouth daily.     mometasone (ELOCON) 0.1 % lotion Apply topically.     nadolol (CORGARD) 20 MG tablet Take 1 tablet (20 mg total) by mouth daily. 90 tablet 3   psyllium (METAMUCIL) 58.6 % powder Take 1 packet by mouth as needed. 3 times a week as needed     simvastatin (ZOCOR) 20 MG tablet Take 1 tablet by mouth daily.     sitaGLIPtin-metformin (JANUMET) 50-1000 MG per tablet Take 1 tablet by mouth daily.      No current facility-administered medications for this visit.    PHYSICAL EXAMINATION: ECOG PERFORMANCE STATUS: 0 - Asymptomatic  Vitals:   07/11/20 1129  BP: (!) 145/53  Pulse: 63  Resp: 16  Temp: 97.6 F (36.4 C)  SpO2: 100%   Filed Weights   07/11/20 1129  Weight: 169 lb 12.8 oz (77 kg)    GENERAL:alert, no distress and comfortable SKIN: skin color, texture, turgor are normal, no rashes or significant lesions EYES: normal, Conjunctiva are pink and non-injected, sclera clear  NECK: supple, thyroid  normal size, non-tender, without nodularity LYMPH:  no palpable lymphadenopathy in the cervical, axillary  LUNGS: clear to auscultation and percussion with normal breathing effort HEART: regular rate & rhythm and no murmurs and no lower extremity edema ABDOMEN:abdomen soft, non-tender and normal bowel sounds Musculoskeletal:no cyanosis of digits and no clubbing  NEURO: alert & oriented x 3 with fluent speech, no focal motor/sensory deficits  LABORATORY DATA:  I have reviewed the data as listed CBC Latest Ref Rng & Units 07/11/2020 06/01/2019 11/24/2018  WBC 4.0 - 10.5 K/uL 3.4(L)  3.9(L) 3.7(L)  Hemoglobin 12.0 - 15.0 g/dL 10.9(L) 11.6(L) 11.2(L)  Hematocrit 36.0 - 46.0 % 34.8(L) 36.5 35.7(L)  Platelets 150 - 400 K/uL 60(L) 60(L) 54(L)     CMP Latest Ref Rng & Units 07/11/2020 11/24/2018 10/26/2018  Glucose 70 - 99 mg/dL 132(H) 133(H) 124(H)  BUN 8 - 23 mg/dL _0 Creatinine 0.44 - 1.00 mg/dL 0.81 0.81 0.76  Sodium 135 - 145 mmol/L 142 141 143  Potassium 3.5 - 5.1 mmol/L 4.4 4.3 4.3  Chloride 98 - 111 mmol/L 107 105 108  CO2 22 - 32 mmol/L _1 Calcium 8.9 - 10.3 mg/dL 9.5 9.8 9.8  Total Protein 6.5 - 8.1 g/dL 6.8 6.8 7.0  Total Bilirubin 0.3 - 1.2 mg/dL 1.1 1.0 1.1  Alkaline Phos 38 - 126 U/L 77 79 73  AST 15 - 41 U/L _2 ALT 0 - 44 U/L _3 RADIOGRAPHIC STUDIES: I have personally reviewed the radiological images as listed and agreed with the findings in the report. No results found.   ASSESSMENT & PLAN:  [name] is a 75 y.o. female with   1. Pancytopenia, predominantly thrombocytopenia, secondary to liver cirrhosis, and component of ITP  -She had mild intermittent anemia and leukopenia in the past, and chronic thrombocytopenia. Bone marrow biopsy was normal. Work up revealed cirrhosis as the cause of her splenomegaly and pancytopenia. -There appears to be immune component of her thrombocytopenia, as it responded to dexamethasone in the past. She is intolerant to prednisone.  -I discussed possible administration of growth factor to stimulate platelet production if platelets fall between 30K before surgery or procedure. If her platelet count gets below 50, we discussed starting her on Doptelet before any invasive procedure or surgery. We can also use Promacta if her plt<30K. She previously also responded to steroids, especially dexa  -She is clinically doing well. Labs today show WBC 3.4, Hg 10.9, plt 60K. She is overall stable.  -Will continue to monitor her at this time. Continue oral iron.     2. Liver cirrhosis,  cryptogenic  -She underwent very thorough work up by Dr. Beryle Beams which was essentially unremarkable, the cause for her liver cirrhosis remains unknown. -CT in 2016 showed esophageal varices and findings consistent with portal venous hypertension -She underwent upper endoscopy on 10/26/2018 by Dr. Henrene Pastor which showed 2-3+ varices in the lower third esophagus moderate portal hypertensive gastropathy. In the entire stomach. She was started on nadolol and oral iron.  -She had abdominal ultrasound today (07/11/20). Results are pending.  -Continue follow-up with Dr. Henrene Pastor   3. DM, HTN, Cancer Screenings -Her sugar has been controlled, on Janumet -Last colonoscopy was 2018 and Endoscopy in 2020 -She continues to do Mammogram yearly in [place], [place]:  -abdominal US performed today. I will call her with the results -will fax her lab results today  to her PCP in [place] -will request her PCP add AFP tumor marker to blood work (including CBC and CMP) in 6 months. -Lab, US abdomen and F/u in 1 year    No problem-specific Assessment & Plan notes found for this encounter.   Orders Placed This Encounter  Procedures   US Abdomen Complete    Standing Status:   Future    Standing Expiration Date:   07/11/2021    Order Specific Question:   Reason for Exam (SYMPTOM  OR DIAGNOSIS REQUIRED)    Answer:   screening for liver cancer    Order Specific Question:   Preferred imaging location?    Answer:   [facility]   AFP tumor marker    Standing Status:   Standing    Number of Occurrences:   2    Standing Expiration Date:   07/11/2021    All questions were answered. The patient knows to call the clinic with any problems, questions or concerns. No barriers to learning was detected. The total time spent in the appointment was 25 minutes.     Truitt Merle, MD 07/11/2020   I, Wilburn Mylar, am acting as scribe for Truitt Merle, MD.   I have reviewed the above documentation for  accuracy and completeness, and I agree with the above.

## 2020-07-11 ENCOUNTER — Encounter: Payer: Self-pay | Admitting: Hematology

## 2020-07-11 ENCOUNTER — Other Ambulatory Visit: Payer: Self-pay

## 2020-07-11 ENCOUNTER — Inpatient Hospital Stay: Payer: Medicare Other | Attending: Hematology

## 2020-07-11 ENCOUNTER — Inpatient Hospital Stay: Payer: Medicare Other | Admitting: Hematology

## 2020-07-11 ENCOUNTER — Ambulatory Visit (HOSPITAL_COMMUNITY)
Admission: RE | Admit: 2020-07-11 | Discharge: 2020-07-11 | Disposition: A | Discharge status: Home / Self Care | Payer: Medicare Other | Source: Ambulatory Visit | Attending: Hematology | Admitting: Hematology

## 2020-07-11 VITALS — BP 145/53 | HR 63 | Temp 97.6°F | Resp 16 | Ht 64.75 in | Wt 169.8 lb

## 2020-07-11 DIAGNOSIS — K7469 Other cirrhosis of liver: Secondary | ICD-10-CM | POA: Diagnosis present

## 2020-07-11 DIAGNOSIS — K7460 Unspecified cirrhosis of liver: Secondary | ICD-10-CM | POA: Insufficient documentation

## 2020-07-11 DIAGNOSIS — D61818 Other pancytopenia: Secondary | ICD-10-CM | POA: Insufficient documentation

## 2020-07-11 DIAGNOSIS — I10 Essential (primary) hypertension: Secondary | ICD-10-CM | POA: Diagnosis not present

## 2020-07-11 DIAGNOSIS — Z85820 Personal history of malignant melanoma of skin: Secondary | ICD-10-CM | POA: Insufficient documentation

## 2020-07-11 LAB — CMP (CANCER CENTER ONLY)
ALT: 14 U/L (ref 0–44)
AST: 24 U/L (ref 15–41)
Albumin: 4.2 g/dL (ref 3.5–5.0)
Alkaline Phosphatase: 77 U/L (ref 38–126)
Anion gap: 8 (ref 5–15)
BUN: 13 mg/dL (ref 8–23)
CO2: 27 mmol/L (ref 22–32)
Calcium: 9.5 mg/dL (ref 8.9–10.3)
Chloride: 107 mmol/L (ref 98–111)
Creatinine: 0.81 mg/dL (ref 0.44–1.00)
GFR, Estimated: >60 mL/min (ref >60)
Glucose, Bld: 132 mg/dL — ABNORMAL HIGH (ref 70–99)
Potassium: 4.4 mmol/L (ref 3.5–5.1)
Sodium: 142 mmol/L (ref 135–145)
Total Bilirubin: 1.1 mg/dL (ref 0.3–1.2)
Total Protein: 6.8 g/dL (ref 6.5–8.1)

## 2020-07-11 LAB — CBC WITH DIFFERENTIAL (CANCER CENTER ONLY)
Abs Immature Granulocytes: 0.01 10*3/uL (ref 0.00–0.07)
Basophils Absolute: 0 10*3/uL (ref 0.0–0.1)
Basophils Relative: 1 %
Eosinophils Absolute: 0.1 10*3/uL (ref 0.0–0.5)
Eosinophils Relative: 4 %
HCT: 34.8 % — ABNORMAL LOW (ref 36.0–46.0)
Hemoglobin: 10.9 g/dL — ABNORMAL LOW (ref 12.0–15.0)
Immature Granulocytes: 0 %
Lymphocytes Relative: 18 %
Lymphs Abs: 0.6 10*3/uL — ABNORMAL LOW (ref 0.7–4.0)
MCH: 27.9 pg (ref 26.0–34.0)
MCHC: 31.3 g/dL (ref 30.0–36.0)
MCV: 89.2 fL (ref 80.0–100.0)
Monocytes Absolute: 0.5 10*3/uL (ref 0.1–1.0)
Monocytes Relative: 15 %
Neutro Abs: 2.1 10*3/uL (ref 1.7–7.7)
Neutrophils Relative %: 62 %
Platelet Count: 60 10*3/uL — ABNORMAL LOW (ref 150–400)
RBC: 3.9 MIL/uL (ref 3.87–5.11)
RDW: 13.9 % (ref 11.5–15.5)
WBC Count: 3.4 10*3/uL — ABNORMAL LOW (ref 4.0–10.5)
nRBC: 0 % (ref 0.0–0.2)

## 2020-07-12 ENCOUNTER — Telehealth: Payer: Self-pay

## 2020-07-12 NOTE — Telephone Encounter (Signed)
07/11/2020 ov and lab results faxed to Dr Laren Boom at [phone]

## 2020-07-16 ENCOUNTER — Telehealth: Payer: Self-pay | Admitting: *Deleted

## 2020-07-16 NOTE — Telephone Encounter (Signed)
-----   Message from Truitt Merle, MD sent at 07/14/2020 11:01 PM EDT ----- Please let pt know her [place] result, she has known liver cirrhosis and splenmagely, no liver mass or other new concerns, thanks   Truitt Merle  07/14/2020

## 2020-07-16 NOTE — Telephone Encounter (Signed)
Notified pt of [place] result per Dr Burr Medico.  Pt expressed appreciation.

## 2020-08-09 ENCOUNTER — Other Ambulatory Visit: Payer: Self-pay

## 2020-08-09 DIAGNOSIS — E663 Overweight: Secondary | ICD-10-CM | POA: Insufficient documentation

## 2020-08-09 DIAGNOSIS — E7800 Pure hypercholesterolemia, unspecified: Secondary | ICD-10-CM | POA: Insufficient documentation

## 2020-08-09 DIAGNOSIS — Z0000 Encounter for general adult medical examination without abnormal findings: Secondary | ICD-10-CM | POA: Insufficient documentation

## 2020-08-09 DIAGNOSIS — Z789 Other specified health status: Secondary | ICD-10-CM | POA: Insufficient documentation

## 2020-08-09 DIAGNOSIS — M1710 Unilateral primary osteoarthritis, unspecified knee: Secondary | ICD-10-CM | POA: Insufficient documentation

## 2020-08-09 DIAGNOSIS — I10 Essential (primary) hypertension: Secondary | ICD-10-CM | POA: Insufficient documentation

## 2020-08-09 DIAGNOSIS — Z92241 Personal history of systemic steroid therapy: Secondary | ICD-10-CM | POA: Insufficient documentation

## 2020-08-09 DIAGNOSIS — L659 Nonscarring hair loss, unspecified: Secondary | ICD-10-CM | POA: Insufficient documentation

## 2020-08-09 DIAGNOSIS — I8393 Asymptomatic varicose veins of bilateral lower extremities: Secondary | ICD-10-CM

## 2020-08-09 DIAGNOSIS — R300 Dysuria: Secondary | ICD-10-CM | POA: Insufficient documentation

## 2020-08-09 DIAGNOSIS — K219 Gastro-esophageal reflux disease without esophagitis: Secondary | ICD-10-CM | POA: Insufficient documentation

## 2020-08-09 DIAGNOSIS — Z79899 Other long term (current) drug therapy: Secondary | ICD-10-CM | POA: Insufficient documentation

## 2020-08-09 DIAGNOSIS — M8580 Other specified disorders of bone density and structure, unspecified site: Secondary | ICD-10-CM | POA: Insufficient documentation

## 2020-08-09 DIAGNOSIS — R748 Abnormal levels of other serum enzymes: Secondary | ICD-10-CM | POA: Insufficient documentation

## 2020-08-09 DIAGNOSIS — G4700 Insomnia, unspecified: Secondary | ICD-10-CM | POA: Insufficient documentation

## 2020-08-09 DIAGNOSIS — N819 Female genital prolapse, unspecified: Secondary | ICD-10-CM | POA: Insufficient documentation

## 2020-08-16 ENCOUNTER — Ambulatory Visit: Payer: Medicare Other | Admitting: Vascular Surgery

## 2020-08-16 ENCOUNTER — Ambulatory Visit (HOSPITAL_COMMUNITY)
Admission: RE | Admit: 2020-08-16 | Discharge: 2020-08-16 | Disposition: A | Discharge status: Home / Self Care | Payer: Medicare Other | Source: Ambulatory Visit | Attending: Vascular Surgery | Admitting: Vascular Surgery

## 2020-08-16 ENCOUNTER — Encounter: Payer: Self-pay | Admitting: Vascular Surgery

## 2020-08-16 ENCOUNTER — Other Ambulatory Visit: Payer: Self-pay

## 2020-08-16 VITALS — BP 153/82 | HR 58 | Temp 97.8°F | Resp 20 | Ht 64.75 in | Wt 172.0 lb

## 2020-08-16 DIAGNOSIS — I8393 Asymptomatic varicose veins of bilateral lower extremities: Secondary | ICD-10-CM | POA: Diagnosis present

## 2020-08-16 NOTE — Progress Notes (Signed)
Patient ID: Yvonne Carrillo, female   DOB: August 03, 1945, 75 y.o.   MRN: [id number]  Reason for Consult: New Patient (Initial Visit) and Varicose Veins   Referred by Tobe Sos, MD  Subjective:     HPI:  Yvonne Carrillo is a 75 y.o. female without significant vascular history.  She does have diabetes, hyperlipidemia and hypertension.  She also has cryptogenic cirrhosis with known thrombocytopenia.  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  She does not have any history of DVT.  She has had worsening varicosities in her left lower extremity for many years.  More recently they have become very uncomfortable including 1 night when they are excruciatingly painful because it will wake from sleep.  She has not had any bleeding or thrombosis issues with the varicosities.  She does have a family history with her mother having had varicose veins they were not present in her father.  They were not present until she was postpartum.  Past Medical History:  Diagnosis Date   Anemia    not current   Cancer ([place])    basal and squamous cell carcinoma   Chronic interstitial cystitis 03/05/2014   Diabetes mellitus without complication ([place])    Hepatic cirrhosis ([place]) 05/08/2014   Hyperlipidemia    Hypertension    Laryngopharyngeal reflux (LPR)    Osteopenia    Thrombocytopenia ([facility]) 03/05/2014   platelet count runs low and very low at times   Family History  Problem Relation Age of Onset   COPD Mother    Heart disease Father    Colon cancer Neg Hx    Colon polyps Neg Hx    Esophageal cancer Neg Hx    Rectal cancer Neg Hx    Stomach cancer Neg Hx    Past Surgical History:  Procedure Laterality Date   BRAIN MENINGIOMA EXCISION     COLONOSCOPY     LAMINECTOMY     PARTIAL HYSTERECTOMY     1993   POLYPECTOMY     [name] in [place], [place] Social History:  Social History   Tobacco Use   Smoking status: Never   Smokeless tobacco: Never  Substance  Use Topics   Alcohol use: No    Alcohol/week: 0.0 standard drinks    Allergies  Allergen Reactions   Epinephrine Other (See Comments)    Increase heart rate   Lisinopril Cough   Penicillins Rash    Current Outpatient Medications  Medication Sig Dispense Refill   Calcium Carbonate-Vitamin D (CALCIUM-VITAMIN D) 600-125 MG-UNIT TABS Take by mouth daily.     cholecalciferol (VITAMIN D) 400 UNITS TABS tablet Take 2,000 Units by mouth daily.     Ferrous Sulfate Dried (HIGH POTENCY IRON) 65 MG TABS Take 1 tablet by mouth daily.     irbesartan (AVAPRO) 75 MG tablet Take 75 mg by mouth daily.     mometasone (ELOCON) 0.1 % lotion Apply topically.     nadolol (CORGARD) 20 MG tablet Take 1 tablet (20 mg total) by mouth daily. 90 tablet 3   psyllium (METAMUCIL) 58.6 % powder Take 1 packet by mouth as needed. 3 times a week as needed     simvastatin (ZOCOR) 20 MG tablet Take 1 tablet by mouth daily.     sitaGLIPtin-metformin (JANUMET) 50-1000 MG per tablet Take 1 tablet by mouth daily.      No current facility-administered medications for this visit.    Review  of Systems  Constitutional:  Constitutional negative. HENT: HENT negative.  Eyes: Eyes negative.  Cardiovascular: Cardiovascular negative.  GI: Gastrointestinal negative.  Musculoskeletal: Positive for leg pain.  Skin: Skin negative.  Neurological: Neurological negative. Hematologic: Hematologic/lymphatic negative.  Psychiatric: Psychiatric negative.       Objective:  Objective   Vitals:   08/16/20 1148  BP: (!) 153/82  Pulse: (!) 58  Resp: 20  Temp: 97.8 F (36.6 C)  SpO2: 98%  Weight: 172 lb (78 kg)  Height: 5' 4.75" (1.645 m)   Body mass index is 28.84 kg/m.  Physical Exam HENT:     Head: Normocephalic.     Nose:     Comments: Wearing a mask Eyes:     Pupils: Pupils are equal, round, and reactive to light.  Cardiovascular:     Rate and Rhythm: Normal rate.     Pulses: Normal pulses.  Pulmonary:      Effort: Pulmonary effort is normal.  Abdominal:     General: Abdomen is flat.     Palpations: Abdomen is soft.  Musculoskeletal:        General: No swelling.     Cervical back: Normal range of motion.     Comments: Large varicosities left lower extremity above and below the knee and a greater saphenous vein distribution There is significant reticular veins bilateral ankles and feet  Skin:    General: Skin is warm.     Capillary Refill: Capillary refill takes less than 2 seconds.  Neurological:     General: No focal deficit present.     Mental Status: She is alert.  Psychiatric:        Mood and Affect: Mood normal.        Behavior: Behavior normal.        Thought Content: Thought content normal.    Data: +--------------+---------+------+-----------+------------+--------+  LEFT          Reflux NoRefluxReflux TimeDiameter cmsComments                          Yes                                   +--------------+---------+------+-----------+------------+--------+  CFV                     yes   >1 second                       +--------------+---------+------+-----------+------------+--------+  FV mid                  yes   >1 second                       +--------------+---------+------+-----------+------------+--------+  Popliteal               yes   >1 second                       +--------------+---------+------+-----------+------------+--------+  GSV at SFJ              yes    >500 ms     0.951              +--------------+---------+------+-----------+------------+--------+  GSV prox thigh          yes    >500 ms  0.584              +--------------+---------+------+-----------+------------+--------+  GSV mid thigh           yes    >500 ms     0.748              +--------------+---------+------+-----------+------------+--------+  GSV dist thigh          yes    >500 ms      0.17               +--------------+---------+------+-----------+------------+--------+  GSV at knee   no                           0.212              +--------------+---------+------+-----------+------------+--------+  GSV prox calf no                            0.34              +--------------+---------+------+-----------+------------+--------+  SSV Pop Fossa           yes    >500 ms     0.586              +--------------+---------+------+-----------+------------+--------+  SSV prox calf no                           0.323              +--------------+---------+------+-----------+------------+--------+  SSV mid calf  no                           0.179              +--------------+---------+------+-----------+------------+--------+           Summary:  Left:  - No evidence of deep vein thrombosis seen in the left lower extremity,  from the common femoral through the popliteal veins.  - No evidence of superficial venous thrombosis in the left lower  extremity.  - The femoral vein is diminutive.   - Deep vein reflux in the CFV, FV, and popliteal vein.  - Superficial vein reflux in the SSV in the popliteal fossa, the SFJ, and  the GSV.      Assessment/Plan:     75 year old female with C2 venous disease with symptomatic varicosities.  These are in her greater saphenous vein distribution with a thigh saphenous vein which is quite large and refluxing.  We will fit her for thigh-high moderate grade compression stockings today as she has been wearing knee-high.  She will follow-up in 3 months to evaluate progression we can discuss possible saphenous vein ablation on the left with possible stab phlebectomy.  All of her questions were answered to her satisfaction today.     Waynetta Sandy MD [facility]

## 2020-11-27 ENCOUNTER — Ambulatory Visit: Payer: Medicare Other | Admitting: Vascular Surgery

## 2021-01-09 ENCOUNTER — Ambulatory Visit: Payer: Medicare Other | Admitting: Hematology

## 2021-01-22 ENCOUNTER — Telehealth: Payer: Self-pay | Admitting: Hematology

## 2021-01-22 NOTE — Telephone Encounter (Signed)
Scheduled appointment per patients request. At the end of the phone call, patient was transferred to RN to inquire additional information.

## 2021-01-23 ENCOUNTER — Other Ambulatory Visit: Payer: Self-pay

## 2021-01-24 ENCOUNTER — Telehealth: Payer: Self-pay

## 2021-01-24 NOTE — Telephone Encounter (Signed)
Returned voicemail message regarding receipt of labwork done on 01/09/2021 by outside facility.  LVM stating Dr. Burr Medico has not received lab results and if pt could please request that the another copy of labs be faxed to Dr. Ernestina Penna office.  Directed pt to call Dr. Ernestina Penna office should the pt need to speak to Dr. Burr Medico or Dr. Ernestina Penna Nurse.

## 2021-01-30 ENCOUNTER — Other Ambulatory Visit: Payer: Self-pay

## 2021-01-30 NOTE — Progress Notes (Signed)
[facility] to obtain pt's labs taken on 01/09/2021.  Spoke with Amy regarding labs.  Amy stated she will fax me the labs drawn on 01/09/2021.  Awaiting Amy's fax.

## 2021-02-04 ENCOUNTER — Encounter: Payer: Self-pay | Admitting: Hematology

## 2021-02-04 ENCOUNTER — Inpatient Hospital Stay: Payer: Medicare Other | Attending: Hematology | Admitting: Hematology

## 2021-02-04 DIAGNOSIS — D696 Thrombocytopenia, unspecified: Secondary | ICD-10-CM

## 2021-02-04 DIAGNOSIS — D61818 Other pancytopenia: Secondary | ICD-10-CM

## 2021-02-04 NOTE — Progress Notes (Signed)
[place]   Telephone:(336) [phone] Fax:(336) [phone]   Clinic Follow up Note   Patient Care Team: Tobe Sos, MD as PCP - General (Internal Medicine) Annia Belt, MD as Consulting Physician (Oncology) Altheimer, Legrand Como, MD as Consulting Physician (Endocrinology) Carol Ada, MD as Consulting Physician (Gastroenterology) Irene Shipper, MD as Consulting Physician (Gastroenterology) Truitt Merle, MD as Consulting Physician (Hematology) Alla Feeling, NP as Nurse Practitioner (Nurse Practitioner)  Date of Service:  02/04/2021  I connected with Yvonne Carrillo on 02/04/2021 at 10:00 AM EST by telephone visit and verified that I am speaking with the correct person using two identifiers.  I discussed the limitations, risks, security and privacy concerns of performing an evaluation and management service by telephone and the availability of in person appointments. I also discussed with the patient that there may be a patient responsible charge related to this service. The patient expressed understanding and agreed to proceed.   Other persons participating in the visit and their role in the encounter:  none  Patient's location:  home Provider's location:  my office  CHIEF COMPLAINT: f/u of pancytopenia  CURRENT THERAPY:  Oral iron once daily starting 11/03/19 per GI  ASSESSMENT & PLAN:  Yvonne Carrillo is a 76 y.o. female with   1. Pancytopenia, predominantly thrombocytopenia, secondary to liver cirrhosis, and component of ITP  -She had mild intermittent anemia and leukopenia in the past, and chronic thrombocytopenia. Bone marrow biopsy was normal. Work up revealed cirrhosis as the cause of her splenomegaly and pancytopenia. -There appears to be immune component of her thrombocytopenia, as it responded to dexamethasone in the past. She is intolerant to prednisone.  -we previously discussed treatment options. We will consider if it becomes necessary. -She is  clinically doing well. Labs from 01/15/21 are overall stable-- WBC 3.4, RBC 4, HGB 11.5, HCT 36.2, PLT 59K -Will continue to monitor her at this time. Continue oral iron once daily      2. Liver cirrhosis, cryptogenic  -She underwent very thorough work up by Dr. Beryle Beams which was essentially unremarkable, the cause for her liver cirrhosis remains unknown. -CT in 2016 showed esophageal varices and findings consistent with portal venous hypertension -upper endoscopy on 10/26/18 by Dr. Henrene Pastor showed 2-3+ varices in the lower third esophagus moderate portal hypertensive gastropathy. In the entire stomach. She was started on nadolol and oral iron.  -last abdominal ultrasound 07/11/20 was stable. Will repeat in 1 year.   3. DM, HTN, Cancer Screenings -Her sugar has been controlled, on Janumet -Last colonoscopy was 2018 and Endoscopy in 2020 -She continues to do Mammogram yearly in [place], [place]:  -Lab, US abdomen and F/u in 5-6 months (scheduled)    No problem-specific Assessment & Plan notes found for this encounter.   INTERVAL HISTORY:  Yvonne Carrillo was contacted for a follow up of pancytopenia. She was last seen by me on 07/11/20.  She reports she is doing well overall; she denies any change or new concerns.   All other systems were reviewed with the patient and are negative.  MEDICAL HISTORY:  Past Medical History:  Diagnosis Date   Anemia    not current   Cancer ([place])    basal and squamous cell carcinoma   Chronic interstitial cystitis 03/05/2014   Diabetes mellitus without complication ([place])    Hepatic cirrhosis ([place]) 05/08/2014   Hyperlipidemia    Hypertension    Laryngopharyngeal reflux (LPR)    Osteopenia  Thrombocytopenia ([place]) 03/05/2014   platelet count runs low and very low at times    SURGICAL HISTORY: Past Surgical History:  Procedure Laterality Date   BRAIN MENINGIOMA EXCISION     COLONOSCOPY     LAMINECTOMY     PARTIAL HYSTERECTOMY      1993   POLYPECTOMY     [name] in [place], [place]    I have reviewed the social history and family history with the patient and they are unchanged from previous note.  ALLERGIES:  is allergic to epinephrine, lisinopril, and penicillins.  MEDICATIONS:  Current Outpatient Medications  Medication Sig Dispense Refill   Calcium Carbonate-Vitamin D (CALCIUM-VITAMIN D) 600-125 MG-UNIT TABS Take by mouth daily.     cholecalciferol (VITAMIN D) 400 UNITS TABS tablet Take 2,000 Units by mouth daily.     Ferrous Sulfate Dried (HIGH POTENCY IRON) 65 MG TABS Take 1 tablet by mouth daily.     irbesartan (AVAPRO) 75 MG tablet Take 75 mg by mouth daily.     mometasone (ELOCON) 0.1 % lotion Apply topically.     nadolol (CORGARD) 20 MG tablet Take 1 tablet (20 mg total) by mouth daily. 90 tablet 3   psyllium (METAMUCIL) 58.6 % powder Take 1 packet by mouth as needed. 3 times a week as needed     simvastatin (ZOCOR) 20 MG tablet Take 1 tablet by mouth daily.     sitaGLIPtin-metformin (JANUMET) 50-1000 MG per tablet Take 1 tablet by mouth daily.      No current facility-administered medications for this visit.    PHYSICAL EXAMINATION: ECOG PERFORMANCE STATUS: 0 - Asymptomatic  There were no vitals filed for this visit. Wt Readings from Last 3 Encounters:  08/16/20 172 lb (78 kg)  07/11/20 169 lb 12.8 oz (77 kg)  11/28/19 170 lb (77.1 kg)     No vitals taken today, Exam not performed today  LABORATORY DATA:  I have reviewed the data as listed CBC Latest Ref Rng & Units 07/11/2020 06/01/2019 11/24/2018  WBC 4.0 - 10.5 K/uL 3.4(L) 3.9(L) 3.7(L)  Hemoglobin 12.0 - 15.0 g/dL 10.9(L) 11.6(L) 11.2(L)  Hematocrit 36.0 - 46.0 % 34.8(L) 36.5 35.7(L)  Platelets 150 - 400 K/uL 60(L) 60(L) 54(L)     CMP Latest Ref Rng & Units 07/11/2020 11/24/2018 10/26/2018  Glucose 70 - 99 mg/dL 132(H) 133(H) 124(H)  BUN 8 - 23 mg/dL 13 12 14   Creatinine 0.44 - 1.00 mg/dL 0.81 0.81 0.76  Sodium 135 - 145 mmol/L 142 141  143  Potassium 3.5 - 5.1 mmol/L 4.4 4.3 4.3  Chloride 98 - 111 mmol/L 107 105 108  CO2 22 - 32 mmol/L 27 27 26   Calcium 8.9 - 10.3 mg/dL 9.5 9.8 9.8  Total Protein 6.5 - 8.1 g/dL 6.8 6.8 7.0  Total Bilirubin 0.3 - 1.2 mg/dL 1.1 1.0 1.1  Alkaline Phos 38 - 126 U/L 77 79 73  AST 15 - 41 U/L 24 24 30   ALT 0 - 44 U/L 14 17 20       RADIOGRAPHIC STUDIES: I have personally reviewed the radiological images as listed and agreed with the findings in the report. No results found.    No orders of the defined types were placed in this encounter.  All questions were answered. The patient knows to call the clinic with any problems, questions or concerns. No barriers to learning was detected. The total time spent in the appointment was 11 minutes.     Truitt Merle, MD 02/04/2021  I, Wilburn Mylar, am acting as scribe for Truitt Merle, MD.   I have reviewed the above documentation for accuracy and completeness, and I agree with the above.

## 2021-02-23 IMAGING — US US ABDOMEN COMPLETE
1 series · 14 of 25 positions shown · non-contrast
Comparison: 06/13/2019

CLINICAL DATA: Cirrhosis

EXAM:
ABDOMEN ULTRASOUND COMPLETE

[Series 1: us abdomen complete · 14 of 82 slices shown]
[im 1/82]
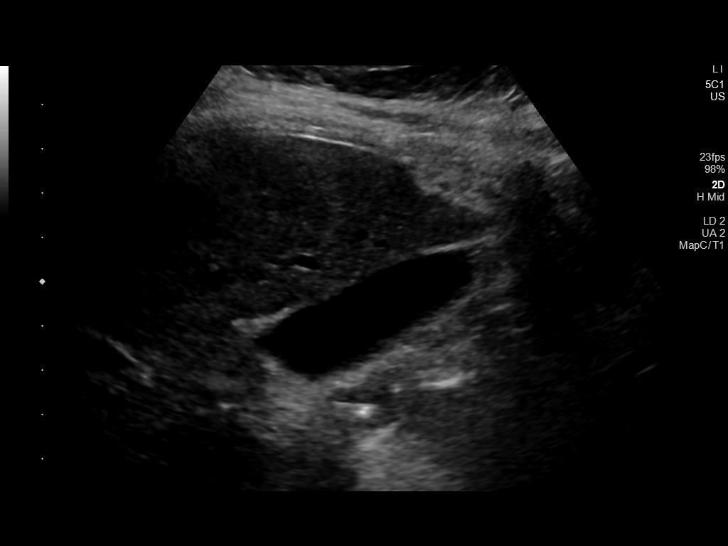
[im 7/82]
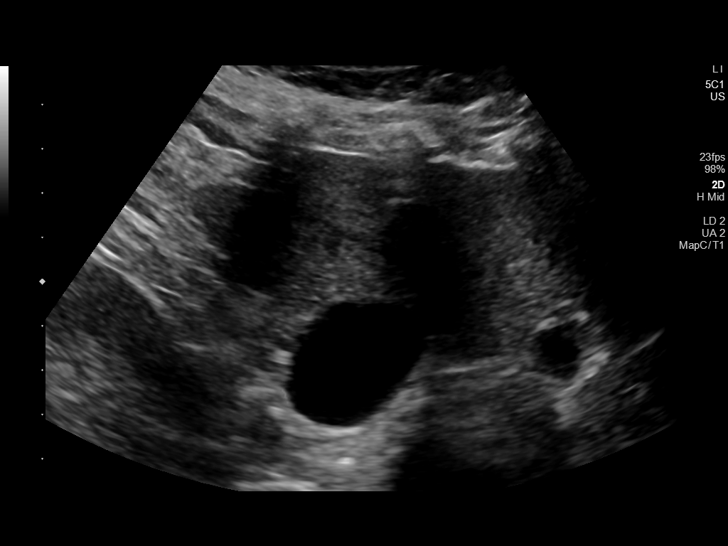
[im 14/82]
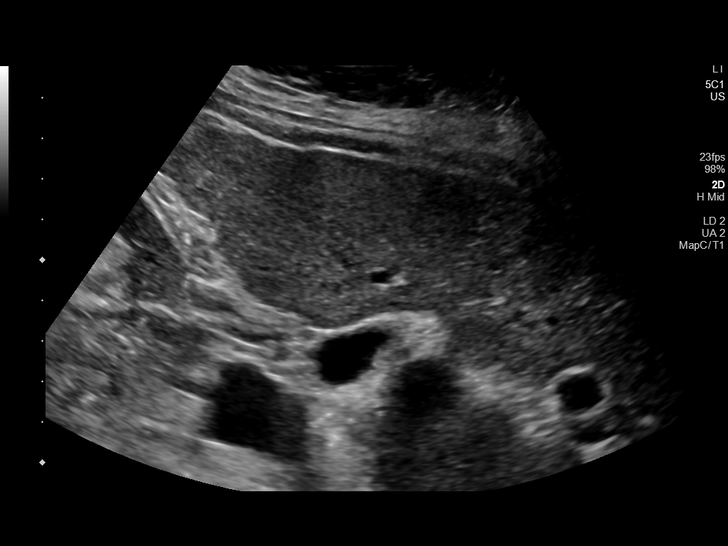
[im 21/82]
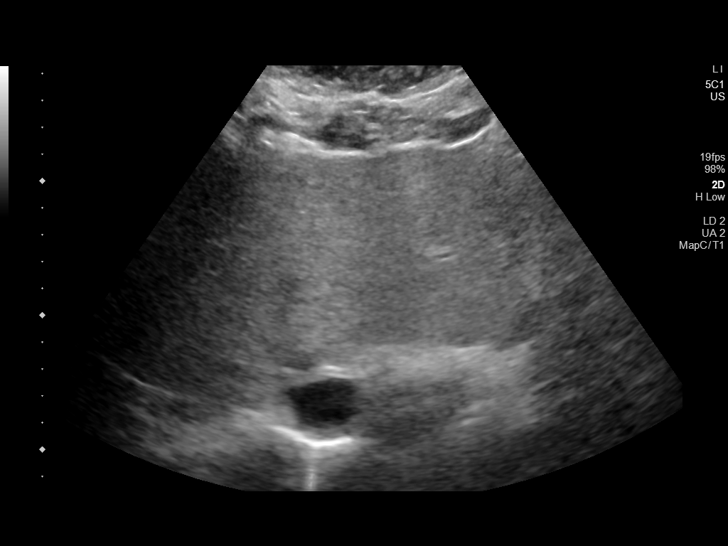
[im 28/82]
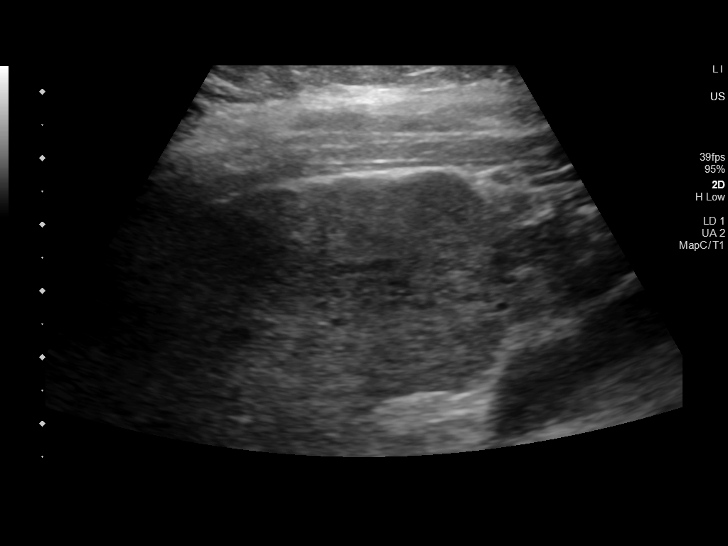
[im 31/82]
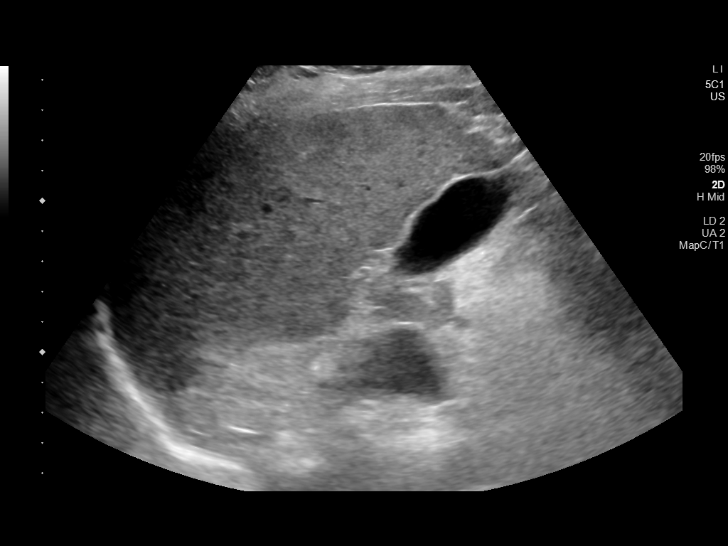
[im 38/82]
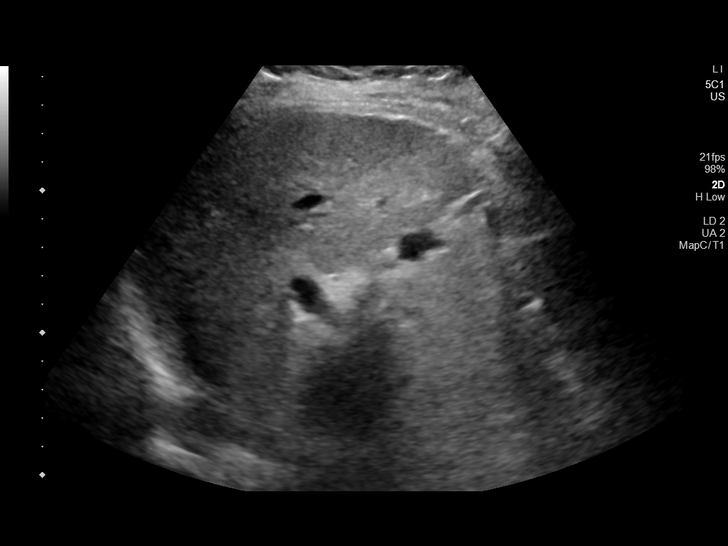
[im 44/82]
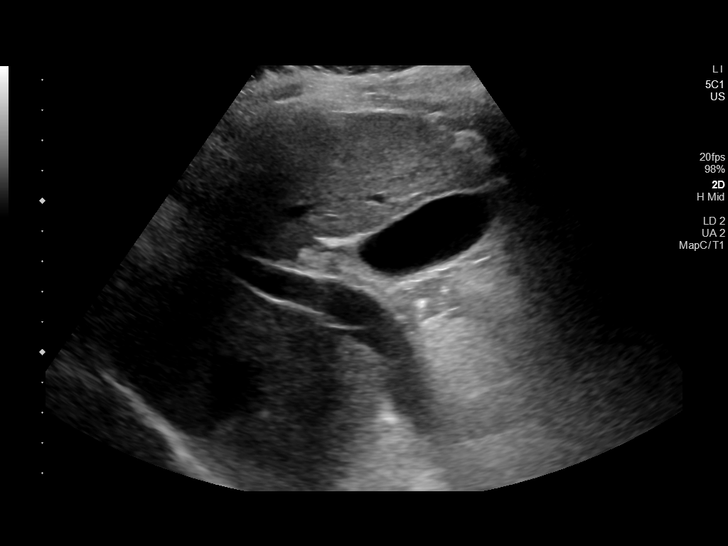
[im 51/82]
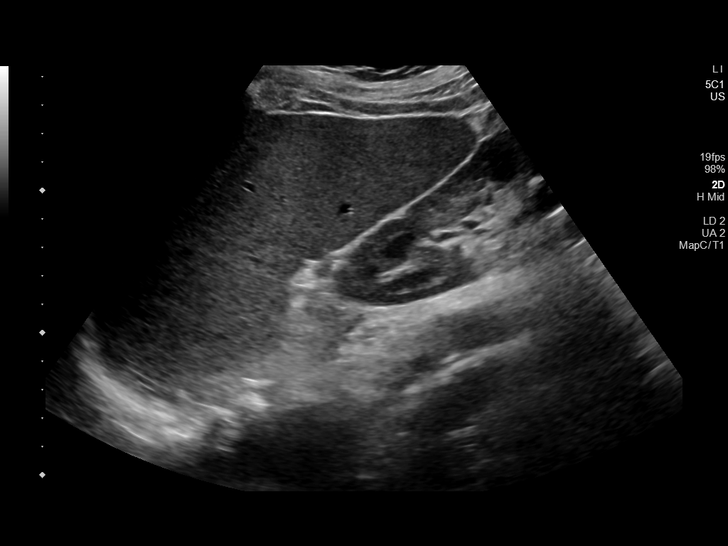
[im 55/82]
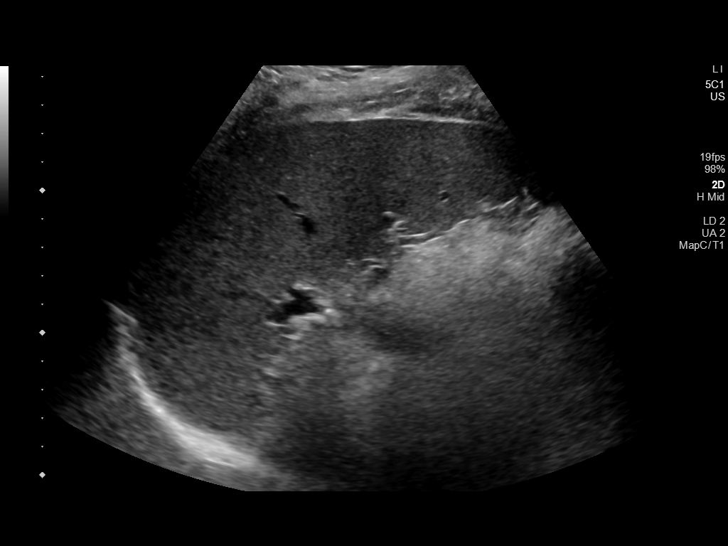
[im 61/82]
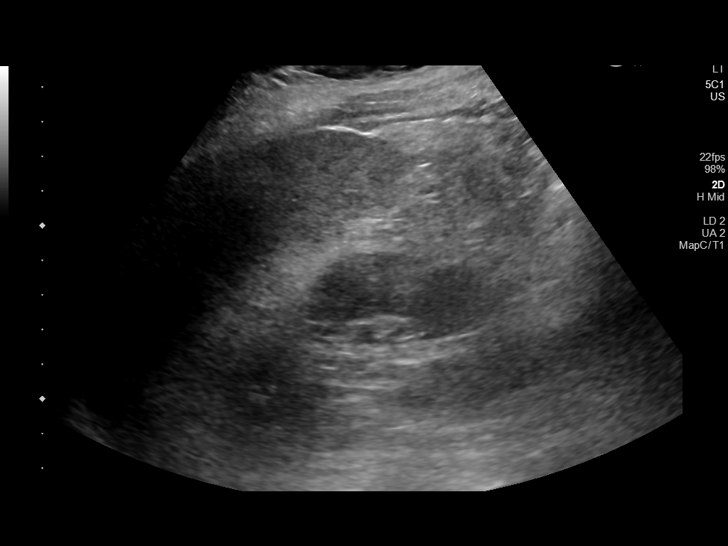
[im 68/82]
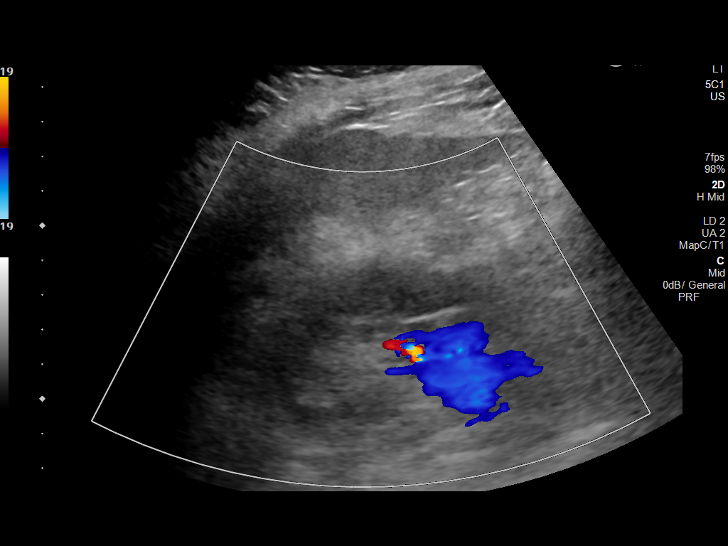
[im 75/82]
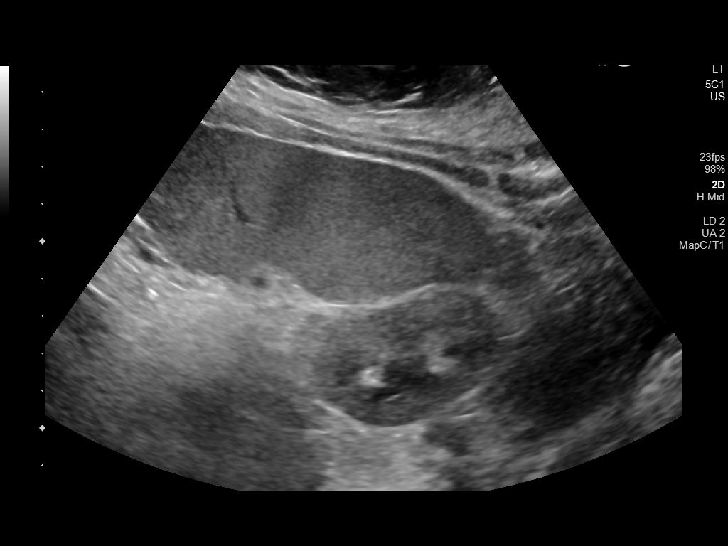
[im 82/82]
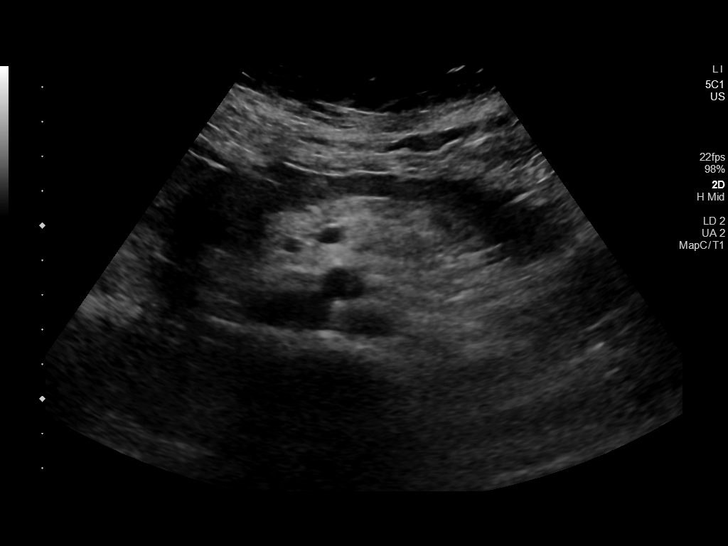

[14 of 25 positions shown; findings below may reference images not displayed]

FINDINGS: Gallbladder: No evidence of cholelithiasis or cholecystitis.

Common bile duct: Diameter: 3 mm

Liver: Coarsened increased echotexture throughout the liver
consistent with known cirrhosis. Nodular contour of the liver
capsule. No focal parenchymal abnormalities. No intrahepatic duct
dilation. Portal vein is patent on color Doppler imaging with normal
direction of blood flow towards the liver.

IVC: No abnormality visualized.

Pancreas: Visualized portion unremarkable.

Spleen: Spleen is enlarged measuring 16.1 x 16.0 x 7.5 cm. No focal
abnormality.

Right Kidney: Length: 11.3 cm. Echogenicity within normal limits. No
mass or hydronephrosis visualized.

Left Kidney: Length: 12.9 cm. Echogenicity within normal limits. No
mass or hydronephrosis visualized.

Abdominal aorta: No aneurysm visualized.

Other findings: No free fluid.
IMPRESSION: 1. Coarsened increased liver echotexture and nodular capsular
contour, compatible with known cirrhosis.
2. Persistent splenomegaly.

## 2021-04-01 ENCOUNTER — Other Ambulatory Visit: Payer: Self-pay

## 2021-07-09 ENCOUNTER — Other Ambulatory Visit: Payer: Self-pay

## 2021-07-10 ENCOUNTER — Encounter: Payer: Self-pay | Admitting: Hematology

## 2021-07-10 ENCOUNTER — Inpatient Hospital Stay: Payer: Medicare Other | Admitting: Hematology

## 2021-07-10 ENCOUNTER — Ambulatory Visit (HOSPITAL_COMMUNITY)
Admission: RE | Admit: 2021-07-10 | Discharge: 2021-07-10 | Disposition: A | Discharge status: Home / Self Care | Payer: Medicare Other | Source: Ambulatory Visit | Attending: Hematology | Admitting: Hematology

## 2021-07-10 ENCOUNTER — Other Ambulatory Visit: Payer: Self-pay | Admitting: *Deleted

## 2021-07-10 ENCOUNTER — Telehealth: Payer: Self-pay

## 2021-07-10 ENCOUNTER — Other Ambulatory Visit: Payer: Self-pay

## 2021-07-10 ENCOUNTER — Inpatient Hospital Stay: Payer: Medicare Other | Attending: Hematology

## 2021-07-10 VITALS — BP 149/58 | HR 79 | Temp 97.9°F | Resp 18 | Ht 64.75 in | Wt 173.4 lb

## 2021-07-10 DIAGNOSIS — D61818 Other pancytopenia: Secondary | ICD-10-CM

## 2021-07-10 DIAGNOSIS — I10 Essential (primary) hypertension: Secondary | ICD-10-CM | POA: Insufficient documentation

## 2021-07-10 DIAGNOSIS — D693 Immune thrombocytopenic purpura: Secondary | ICD-10-CM | POA: Insufficient documentation

## 2021-07-10 DIAGNOSIS — E119 Type 2 diabetes mellitus without complications: Secondary | ICD-10-CM

## 2021-07-10 DIAGNOSIS — Z85828 Personal history of other malignant neoplasm of skin: Secondary | ICD-10-CM | POA: Insufficient documentation

## 2021-07-10 DIAGNOSIS — K7460 Unspecified cirrhosis of liver: Secondary | ICD-10-CM | POA: Diagnosis not present

## 2021-07-10 DIAGNOSIS — K7469 Other cirrhosis of liver: Secondary | ICD-10-CM

## 2021-07-10 LAB — CBC WITH DIFFERENTIAL (CANCER CENTER ONLY)
Abs Immature Granulocytes: 0.01 10*3/uL (ref 0.00–0.07)
Basophils Absolute: 0 10*3/uL (ref 0.0–0.1)
Basophils Relative: 1 %
Eosinophils Absolute: 0.1 10*3/uL (ref 0.0–0.5)
Eosinophils Relative: 4 %
HCT: 34.4 % — ABNORMAL LOW (ref 36.0–46.0)
Hemoglobin: 11.4 g/dL — ABNORMAL LOW (ref 12.0–15.0)
Immature Granulocytes: 0 %
Lymphocytes Relative: 21 %
Lymphs Abs: 0.7 10*3/uL (ref 0.7–4.0)
MCH: 29 pg (ref 26.0–34.0)
MCHC: 33.1 g/dL (ref 30.0–36.0)
MCV: 87.5 fL (ref 80.0–100.0)
Monocytes Absolute: 0.5 10*3/uL (ref 0.1–1.0)
Monocytes Relative: 14 %
Neutro Abs: 2.1 10*3/uL (ref 1.7–7.7)
Neutrophils Relative %: 60 %
Platelet Count: 54 10*3/uL — ABNORMAL LOW (ref 150–400)
RBC: 3.93 MIL/uL (ref 3.87–5.11)
RDW: 14 % (ref 11.5–15.5)
WBC Count: 3.4 10*3/uL — ABNORMAL LOW (ref 4.0–10.5)
nRBC: 0 % (ref 0.0–0.2)

## 2021-07-10 LAB — CMP (CANCER CENTER ONLY)
ALT: 20 U/L (ref 0–44)
AST: 36 U/L (ref 15–41)
Albumin: 4.3 g/dL (ref 3.5–5.0)
Alkaline Phosphatase: 77 U/L (ref 38–126)
Anion gap: 5 (ref 5–15)
BUN: 17 mg/dL (ref 8–23)
CO2: 28 mmol/L (ref 22–32)
Calcium: 9.8 mg/dL (ref 8.9–10.3)
Chloride: 106 mmol/L (ref 98–111)
Creatinine: 0.79 mg/dL (ref 0.44–1.00)
GFR, Estimated: >60 mL/min (ref >60)
Glucose, Bld: 160 mg/dL — ABNORMAL HIGH (ref 70–99)
Potassium: 4.8 mmol/L (ref 3.5–5.1)
Sodium: 139 mmol/L (ref 135–145)
Total Bilirubin: 1.1 mg/dL (ref 0.3–1.2)
Total Protein: 6.9 g/dL (ref 6.5–8.1)

## 2021-07-10 LAB — IRON AND IRON BINDING CAPACITY (CC-WL,HP ONLY)
Iron: 71 ug/dL (ref 28–170)
Saturation Ratios: 19 % (ref 10.4–31.8)
TIBC: 382 ug/dL (ref 250–450)
UIBC: 311 ug/dL (ref 148–442)

## 2021-07-10 LAB — HEMOGLOBIN A1C
Hgb A1c MFr Bld: 7.8 % — ABNORMAL HIGH (ref 4.8–5.6)
Mean Plasma Glucose: 177.16 mg/dL

## 2021-07-10 LAB — FERRITIN: Ferritin: 52 ng/mL (ref 11–307)

## 2021-07-10 NOTE — Progress Notes (Signed)
[facility]   [phone] [phone]   Clinic Follow up Note   Patient Care Team: Tobe Sos, MD as PCP - General (Internal Medicine) Annia Belt, MD as Consulting Physician (Oncology) Altheimer, Legrand Como, MD as Consulting Physician (Endocrinology) Carol Ada, MD as Consulting Physician (Gastroenterology) Irene Shipper, MD as Consulting Physician (Gastroenterology) Truitt Merle, MD as Consulting Physician (Hematology) Alla Feeling, NP as Nurse Practitioner (Nurse Practitioner)  Date of Service:  07/10/2021  CHIEF COMPLAINT: f/u of pancytopenia  CURRENT THERAPY:  Oral iron once daily, starting 11/03/19   ASSESSMENT & PLAN:  Yvonne Carrillo is a 76 y.o. female with   1. Pancytopenia, predominantly thrombocytopenia, secondary to liver cirrhosis, and component of ITP  -She had mild intermittent anemia and leukopenia in the past, and chronic thrombocytopenia. Bone marrow biopsy was normal. Work up revealed cirrhosis as the cause of her splenomegaly and pancytopenia. -There appears to be immune component of her thrombocytopenia, as it responded to dexamethasone in the past. She is intolerant to prednisone.  -She is clinically doing well. Labs are overall stable-- WBC 3.4, HGB 11.4, HCT 34.4, PLT 54K -Will continue to monitor her annually for now. Continue oral iron once daily      2. Liver cirrhosis, cryptogenic  -She underwent very thorough work up by Dr. Beryle Beams which was essentially unremarkable, the cause for her liver cirrhosis remains unknown. -CT in 2016 showed esophageal varices and findings consistent with portal venous hypertension -upper endoscopy on 10/26/18 by Dr. Henrene Pastor showed 2-3+ varices in the lower third esophagus moderate portal hypertensive gastropathy. In the entire stomach. She was started on nadolol and oral iron.  -abdominal ultrasound today, 07/10/21, result is still pending. Will repeat in 1 year.   3. DM, HTN,  Cancer Screenings -Her sugar has been controlled, on Janumet -Last colonoscopy was 2018 and Endoscopy in 2020 -She continues to do Mammogram yearly in [place]:  -Lab, US abdomen, and F/u in one year -I add iron study and A1c to her lab today, will send result to her PCP    No problem-specific Assessment & Plan notes found for this encounter.   INTERVAL HISTORY:  Yvonne Carrillo is here for a follow up of pancytopenia. She was last seen by me on 02/04/21. She presents to the clinic alone. She reports she is doing well overall, denies bleeding.   All other systems were reviewed with the patient and are negative.  MEDICAL HISTORY:  Past Medical History:  Diagnosis Date   Anemia    not current   Cancer ([place])    basal and squamous cell carcinoma   Chronic interstitial cystitis 03/05/2014   Diabetes mellitus without complication ([place])    Hepatic cirrhosis ([place]) 05/08/2014   Hyperlipidemia    Hypertension    Laryngopharyngeal reflux (LPR)    Osteopenia    Thrombocytopenia ([place]) 03/05/2014   platelet count runs low and very low at times    SURGICAL HISTORY: Past Surgical History:  Procedure Laterality Date   BRAIN MENINGIOMA EXCISION     COLONOSCOPY     LAMINECTOMY     PARTIAL HYSTERECTOMY     1993   POLYPECTOMY     [name] in [place], [place]    I have reviewed the social history and family history with the patient and they are unchanged from previous note.  ALLERGIES:  is allergic to epinephrine, lisinopril, and penicillins.  MEDICATIONS:  Current Outpatient Medications  Medication  Sig Dispense Refill   Calcium Carbonate-Vitamin D (CALCIUM-VITAMIN D) 600-125 MG-UNIT TABS Take by mouth daily.     cholecalciferol (VITAMIN D) 400 UNITS TABS tablet Take 2,000 Units by mouth daily.     Ferrous Sulfate Dried (HIGH POTENCY IRON) 65 MG TABS Take 1 tablet by mouth daily.     irbesartan (AVAPRO) 75 MG tablet Take 75 mg by mouth daily.     nadolol (CORGARD) 20  MG tablet Take 1 tablet (20 mg total) by mouth daily. 90 tablet 3   psyllium (METAMUCIL) 58.6 % powder Take 1 packet by mouth as needed. 3 times a week as needed     simvastatin (ZOCOR) 20 MG tablet Take 1 tablet by mouth daily.     sitaGLIPtin-metformin (JANUMET) 50-1000 MG per tablet Take 1 tablet by mouth daily.      No current facility-administered medications for this visit.    PHYSICAL EXAMINATION: ECOG PERFORMANCE STATUS: 0 - Asymptomatic  Vitals:   07/10/21 1204 07/10/21 1208  BP: (!) 132/40 (!) 149/58  Pulse:    Resp:    Temp:    SpO2:     Wt Readings from Last 3 Encounters:  07/10/21 173 lb 6.4 oz (78.7 kg)  08/16/20 172 lb (78 kg)  07/11/20 169 lb 12.8 oz (77 kg)     GENERAL:alert, no distress and comfortable SKIN: skin color normal, no rashes or significant lesions EYES: normal, Conjunctiva are pink and non-injected, sclera clear  NEURO: alert & oriented x 3 with fluent speech  LABORATORY DATA:  I have reviewed the data as listed    Latest Ref Rng & Units 07/10/2021    9:18 AM 07/11/2020   11:04 AM 06/01/2019    9:40 AM  CBC  WBC 4.0 - 10.5 K/uL 3.4  3.4  3.9   Hemoglobin 12.0 - 15.0 g/dL 11.4  10.9  11.6   Hematocrit 36.0 - 46.0 % 34.4  34.8  36.5   Platelets 150 - 400 K/uL 54  60  60         Latest Ref Rng & Units 07/10/2021    9:18 AM 07/11/2020   11:04 AM 11/24/2018   12:19 PM  CMP  Glucose 70 - 99 mg/dL 160  132  133   BUN 8 - 23 mg/dL 17  13  12    Creatinine 0.44 - 1.00 mg/dL 0.79  0.81  0.81   Sodium 135 - 145 mmol/L 139  142  141   Potassium 3.5 - 5.1 mmol/L 4.8  4.4  4.3   Chloride 98 - 111 mmol/L 106  107  105   CO2 22 - 32 mmol/L 28  27  27    Calcium 8.9 - 10.3 mg/dL 9.8  9.5  9.8   Total Protein 6.5 - 8.1 g/dL 6.9  6.8  6.8   Total Bilirubin 0.3 - 1.2 mg/dL 1.1  1.1  1.0   Alkaline Phos 38 - 126 U/L 77  77  79   AST 15 - 41 U/L 36  24  24   ALT 0 - 44 U/L 20  14  17        RADIOGRAPHIC STUDIES: I have personally reviewed the  radiological images as listed and agreed with the findings in the report. No results found.    Orders Placed This Encounter  Procedures   US Abdomen Complete    Standing Status:   Future    Order Specific Question:   Reason for Exam (SYMPTOM  OR  DIAGNOSIS REQUIRED)    Answer:   [facility] survelliance    Order Specific Question:   Preferred imaging location?    Answer:   [facility]   Ferritin    Add to lab draw today    Standing Status:   Future    Number of Occurrences:   1    Standing Expiration Date:   07/11/2022   Hemoglobin A1c    ADD TO LAB DRAW TODAY    Standing Status:   Future    Number of Occurrences:   1    Standing Expiration Date:   07/10/2022   All questions were answered. The patient knows to call the clinic with any problems, questions or concerns. No barriers to learning was detected. The total time spent in the appointment was 30 minutes.     Truitt Merle, MD 07/10/2021   I, Wilburn Mylar, am acting as scribe for Truitt Merle, MD.   I have reviewed the above documentation for accuracy and completeness, and I agree with the above.

## 2021-07-10 NOTE — Telephone Encounter (Signed)
[facility] Faxed pt's lab results to pt's PCP that have resulted (CBC w/Diff, CMP, Ferritin, and Iron binding panel).  Pt's HbgA1c was still process on 07/10/2021 '@1607'$ .  Fax confirmation was received.

## 2021-07-11 ENCOUNTER — Encounter: Payer: Self-pay | Admitting: Hematology

## 2021-07-12 LAB — AFP TUMOR MARKER: AFP, Serum, Tumor Marker: <1.8 ng/mL (ref 0.0–9.2)

## 2021-07-14 ENCOUNTER — Encounter: Payer: Self-pay | Admitting: Hematology

## 2021-07-14 ENCOUNTER — Telehealth: Payer: Self-pay | Admitting: Hematology

## 2021-07-14 NOTE — Telephone Encounter (Signed)
Scheduled follow-up appointment per 6/15 los. Patient is aware.

## 2021-07-15 ENCOUNTER — Telehealth: Payer: Self-pay

## 2021-07-15 NOTE — Telephone Encounter (Signed)
Pt Hbg A1c lab faxed to pt's PCP via [facility] fax.  Fax confirmation received.

## 2021-07-23 NOTE — Progress Notes (Signed)
This nurse received a signed authorization from this patient to fax results of US Abdomen complete to Dr. Shon Millet.  [facility] report faxed electronically.  No further concerns at this time.  Signed authorization sent to be scanned to patient chart.

## 2021-09-19 ENCOUNTER — Ambulatory Visit: Payer: Medicare Other | Admitting: Internal Medicine

## 2021-09-19 ENCOUNTER — Encounter: Payer: Self-pay | Admitting: Internal Medicine

## 2021-09-19 VITALS — BP 118/68 | HR 69 | Ht 64.25 in | Wt 174.2 lb

## 2021-09-19 DIAGNOSIS — K766 Portal hypertension: Secondary | ICD-10-CM | POA: Diagnosis not present

## 2021-09-19 DIAGNOSIS — K7469 Other cirrhosis of liver: Secondary | ICD-10-CM | POA: Diagnosis not present

## 2021-09-19 DIAGNOSIS — I8500 Esophageal varices without bleeding: Secondary | ICD-10-CM | POA: Diagnosis not present

## 2021-09-19 NOTE — Progress Notes (Signed)
HISTORY OF PRESENT ILLNESS:  Yvonne Carrillo is a 76 y.o. female, former radiology coordinator from [place], with a history of cryptogenic cirrhosis worked up extensively elsewhere.  She has been followed by hematology/oncology.  She presents today for routine follow-up.  She was last evaluated November 28, 2019.  The assessment and plan from that evaluation as follows:  ASSESSMENT:   1.  Cryptogenic cirrhosis.  Compensated.  Meld 10 2.  Portal hypertension with thrombocytopenia, splenomegaly, portal hypertensive gastropathy, and esophageal varices.  Remains on nadolol and iron.  Acceptable blood pressure and heart rate.  Normal hemoglobin. 3.  History of adenomatous colon polyps.  Surveillance up-to-date 4.  Question silent reflux. 5.  Constipation, mild. 6.  Intermittent problems with bloating and gas   PLAN:   1.  Check alpha-fetoprotein and PT/INR today 2.  Follow-up on abdominal ultrasound from earlier today 3.  Check tissue transglutaminase antibody for intermittent problems with bloating and gas 4.  Routine office follow-up 1 year 5.  Surveillance colonoscopy around 2023 6.  Okay to take analgesics such as Tylenol or NSAIDs and usual doses for short periods of time, as needed.   Since that time she has had no particular GI issues.  She was seen by Dr. Burr Medico July 10, 2021.  Reviewed.  Blood work from that day revealed normal comprehensive metabolic panel except for glucose of 160.  Normal iron studies.  CBC revealed white blood cell count 3.4, hemoglobin 11.4, platelet count 54,000.  Alpha-fetoprotein less than 1.8, hemoglobin A1c 7.8.  Abdominal ultrasound performed July 10, 2021 revealed hepatic cirrhosis with no mass.  Marked splenomegaly with cannulization of the umbilical vein consistent with portal hypertension.  She also has a history of adenomatous colon polyps.  Last colonoscopy November 2018 with diminutive adenoma.  Also left-sided diverticulosis and hemorrhoids.   Follow-up about 5 years (history of multiple adenomas) recommended.  Finally, her last upper endoscopy was performed October 26, 2018.  She was found to have 1 column of 2-3+ varices without stigmata and 2 columns of 1+ varices.  She was placed on nadolol and iron.  She denies problems with mentation or lower extremity edema.  No GI bleeding.  She has a number of other questions regarding possible silent reflux as manifested by dry throat at night, questions regarding the form of her stools (ball-like), the need for iron on a daily basis.  She also shows me her blood pressure and heart rates.  Heart rates are around 60.  Sometimes in the 7s.  Good blood pressure.  REVIEW OF SYSTEMS:  All non-GI ROS negative except for  Past Medical History:  Diagnosis Date   Anemia    not current   Cancer ([place])    basal and squamous cell carcinoma   Chronic interstitial cystitis 03/05/2014   Diabetes mellitus without complication ([place])    Hepatic cirrhosis ([place]) 05/08/2014   Hyperlipidemia    Hypertension    Laryngopharyngeal reflux (LPR)    Osteopenia    Thrombocytopenia ([place]) 03/05/2014   platelet count runs low and very low at times    Past Surgical History:  Procedure Laterality Date   [facility] in [place], [place]  reports that she has never smoked. She has never used smokeless tobacco. She reports that she does  not drink alcohol and does not use drugs.  family history includes COPD in her mother; Heart disease in her father.  Allergies  Allergen Reactions   Epinephrine Other (See Comments)    Increase heart rate   Lisinopril Cough   Penicillins Rash       PHYSICAL EXAMINATION: Vital signs: BP 118/68 (BP Location: Left Arm, Patient Position: Sitting, Cuff Size: Normal)   Pulse 69   Ht 5' 4.25" (1.632 m) Comment: height measure without shoes   Wt 174 lb 3.2 oz (79 kg)   SpO2 98%   BMI 29.67 kg/m   Constitutional: generally well-appearing, no acute distress Psychiatric: alert and oriented x3, cooperative Eyes: extraocular movements intact, anicteric, conjunctiva pink Mouth: oral pharynx moist, no lesions Neck: supple no lymphadenopathy Cardiovascular: heart regular rate and rhythm, no murmur Lungs: clear to auscultation bilaterally Abdomen: soft, nontender, nondistended, no obvious ascites, no peritoneal signs, normal bowel sounds, no organomegaly Rectal: Omitted Extremities: no clubbing, cyanosis, or lower extremity edema bilaterally Skin: no lesions on visible extremities Neuro: No focal deficits.  No asterixis  ASSESSMENT:  1.  Cryptogenic cirrhosis.  Compensated.  MELD 10 2.  Portal hypertension with pancytopenia, principally thrombocytopenia.  Also with esophageal varices and portal hypertensive gastropathy.  She remains on nadolol and iron.  Good target heart rate and blood pressures.  Essentially normal hemoglobin.  Normal iron studies. 3.  History of adenomatous colon polyps.  Surveillance up-to-date 4.  Again, question silent reflux 5.  Stool consistency 6.  General medical problems   PLAN:  1.  Continue with ultrasounds and alpha-fetoprotein (this has been managed by hematology) 2.  Continue nadolol and iron 3.  Surveillance colonoscopy upcoming 4.  Increase Metamucil 5.  Recommended a 2-week trial of Prilosec to see if this helps with nocturnal pharyngeal symptoms 6.  Routine GI follow-up 1 year 7.  Interval follow-up as needed A total time of 30 minutes was spent preparing to see the patient, reviewing a myriad of laboratories and x-rays, pending comprehensive history, performing medically appropriate physical examination, counseling and educating the patient regarding the above listed issues, answering multiple questions to the patient's satisfaction, and documenting clinical information in the health  record

## 2021-09-19 NOTE — Patient Instructions (Signed)
_______________________________________________________  If you are age 76 or older, your body mass index should be between 23-30. Your Body mass index is 29.67 kg/m. If this is out of the aforementioned range listed, please consider follow up with your Primary Care Provider.  If you are age 56 or younger, your body mass index should be between 19-25. Your Body mass index is 29.67 kg/m. If this is out of the aformentioned range listed, please consider follow up with your Primary Care Provider.   ________________________________________________________  The [facility] providers would like to encourage you to use [facility] to communicate with providers for non-urgent requests or questions.  Due to long hold times on the telephone, sending your provider a message by [facility] may be a faster and more efficient way to get a response.  Please allow 48 business hours for a response.  Please remember that this is for non-urgent requests.  _______________________________________________________  Please follow up in one year

## 2021-10-07 IMAGING — US US ABDOMEN COMPLETE
1 series · 15 of 25 positions shown · non-contrast
Comparison: November 28, 2019

CLINICAL DATA: Cirrhosis.  Follow-up.

EXAM:
ABDOMEN ULTRASOUND COMPLETE

[Series 1: us abdomen complete mc & wl · 15 of 84 slices shown]
[im 1/84]
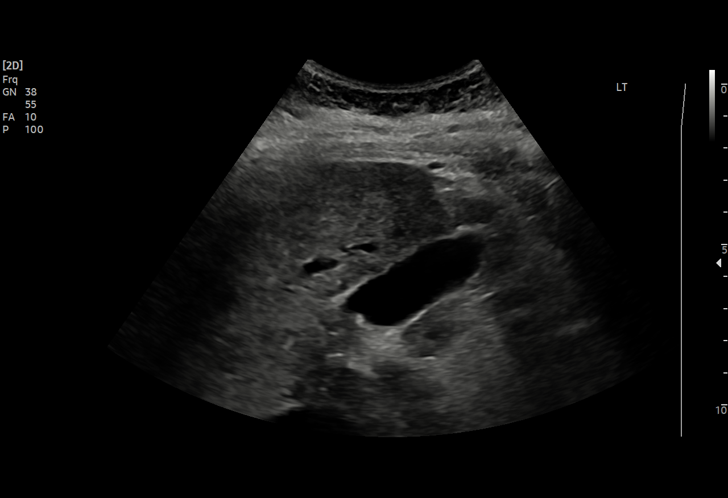
[im 7/84]
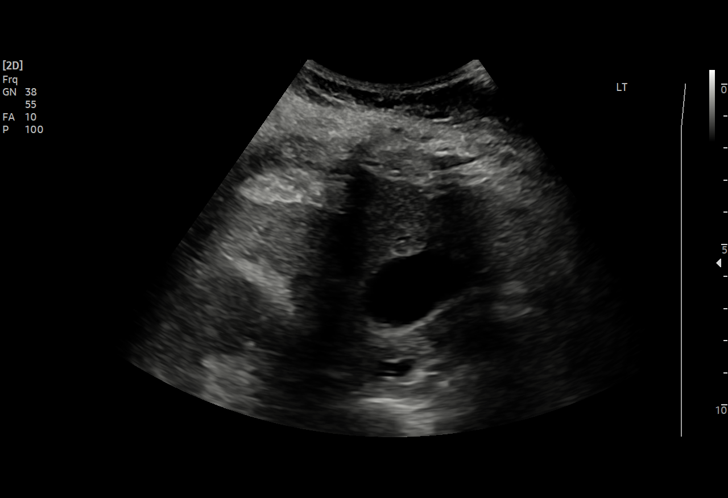
[im 14/84]
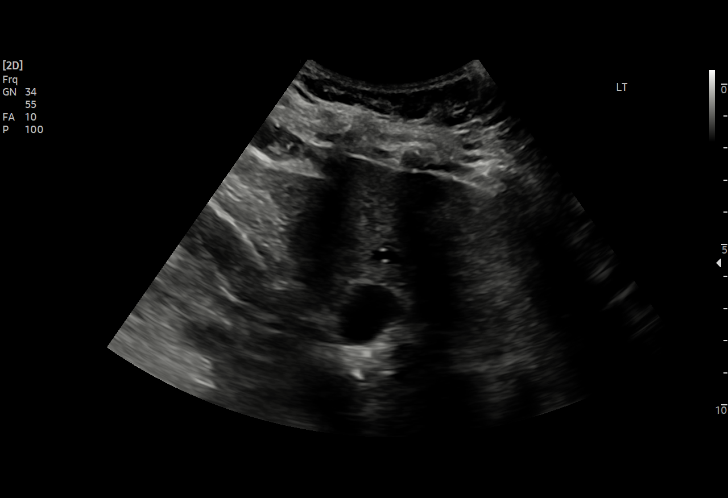
[im 18/84]
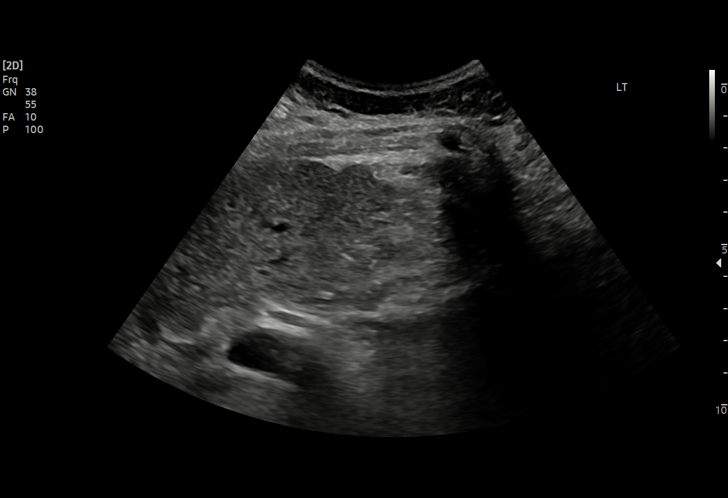
[im 25/84]
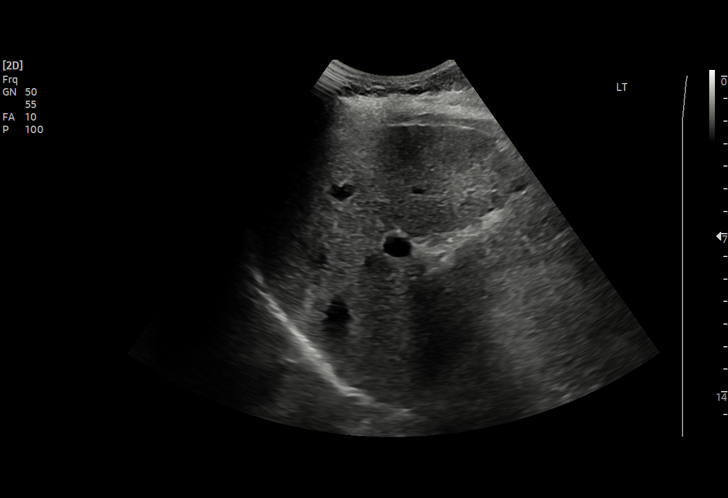
[im 32/84]
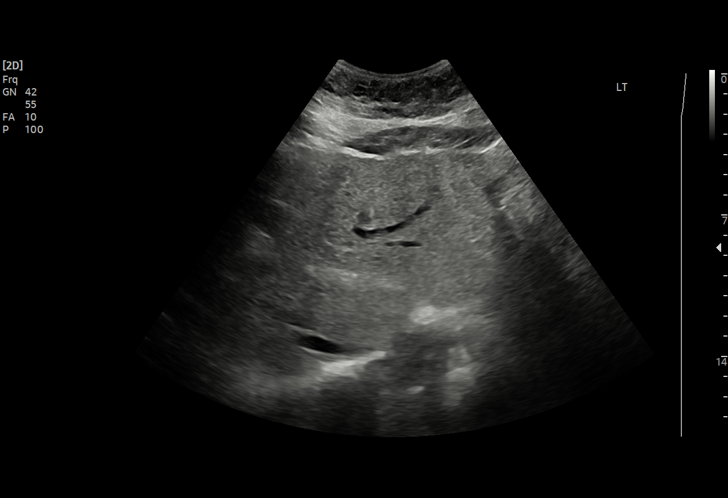
[im 35/84]
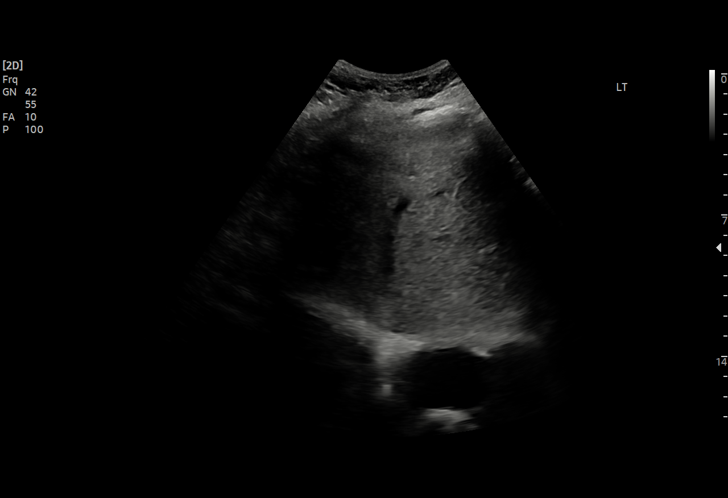
[im 42/84]
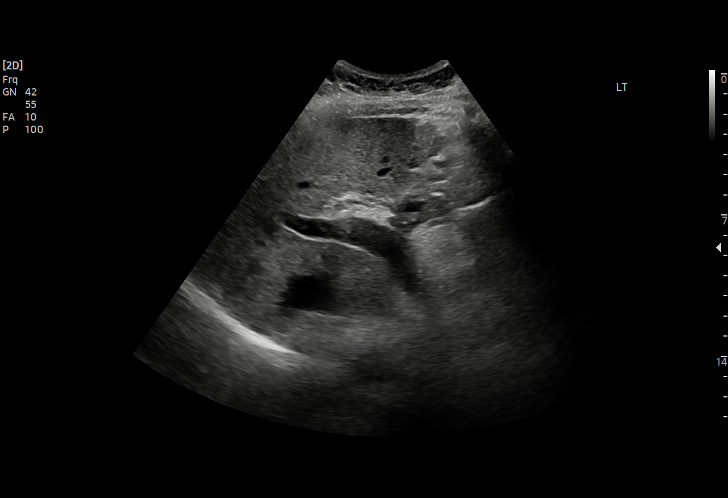
[im 49/84]
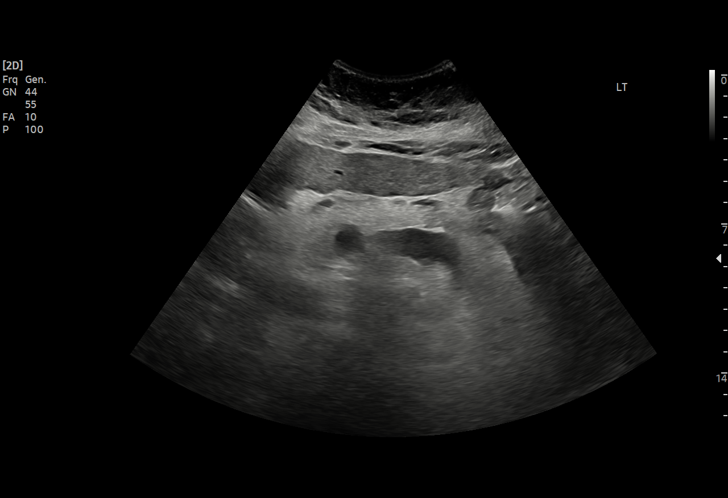
[im 52/84]
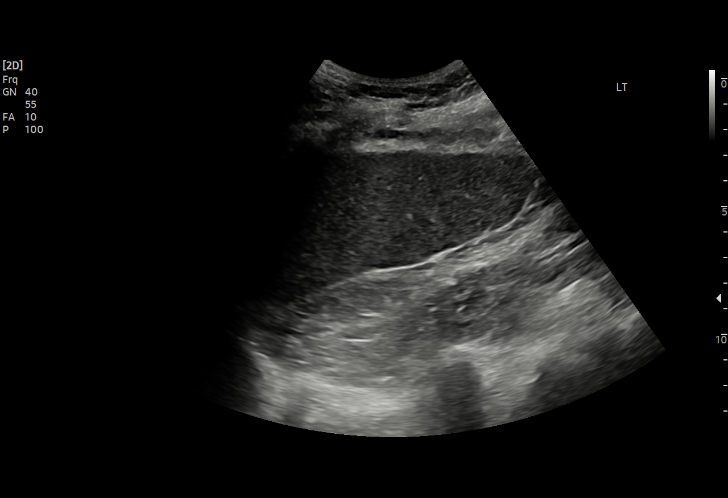
[im 59/84]
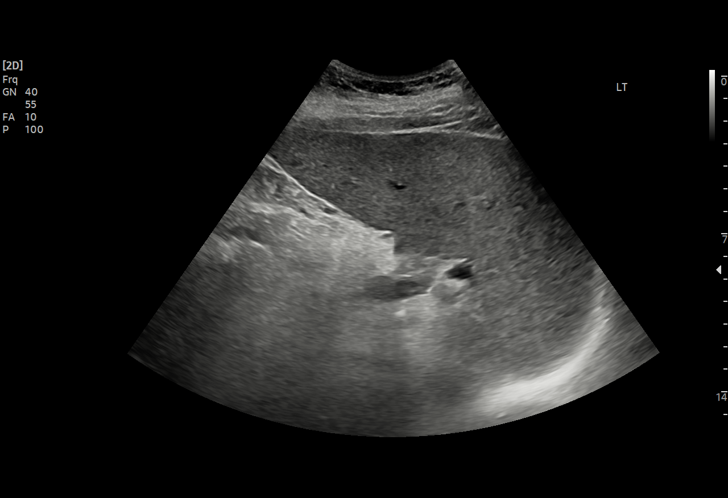
[im 66/84]
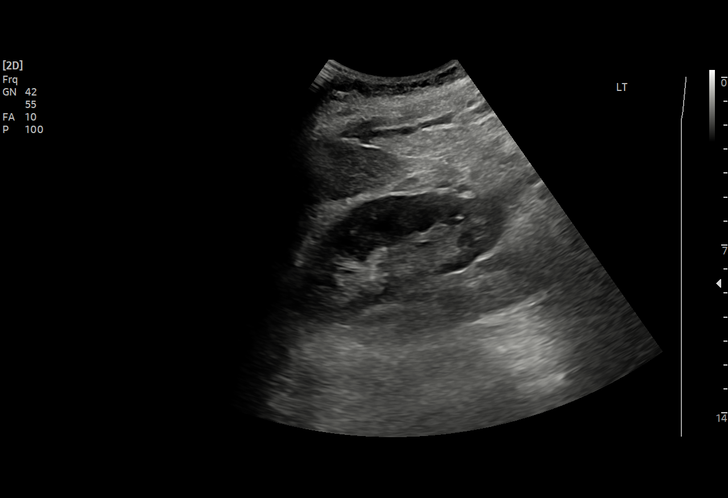
[im 70/84]
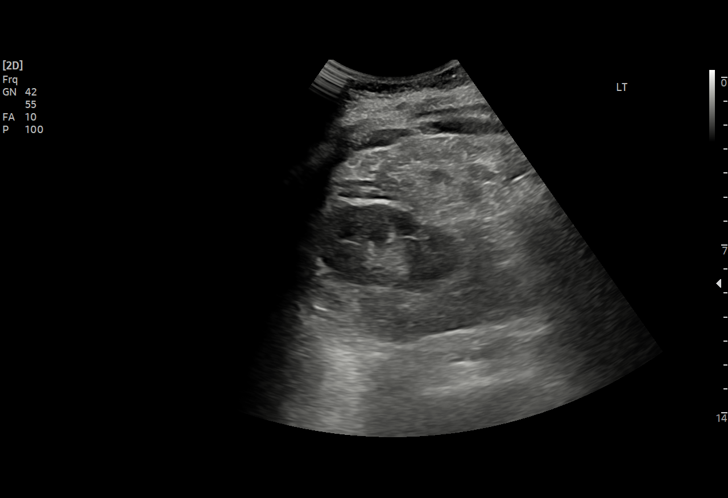
[im 77/84]
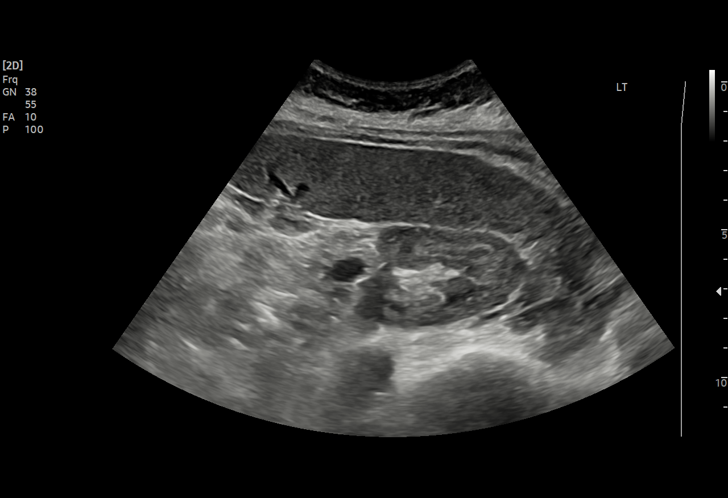
[im 84/84]
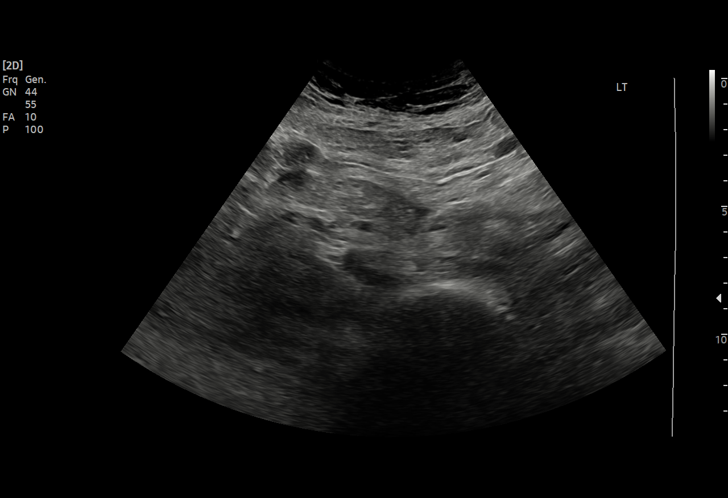

[15 of 25 positions shown; findings below may reference images not displayed]

FINDINGS: Gallbladder: No gallstones or wall thickening visualized. No
sonographic Murphy sign noted by sonographer.

Common bile duct: Diameter: 3.1 mm

Liver: Nodular contour with thick echogenic coarsened echotexture.
No focal mass. Portal vein is patent on color Doppler imaging with
normal direction of blood flow towards the liver.

IVC: No abnormality visualized.

Pancreas: Visualized portion unremarkable.

Spleen: The spleen measures 15.8 x 7.8 x 17.5 cm today versus 16.0 x
7.5 x 16.1 cm previously. The splenic volume is 3446 cc today.

Right Kidney: Length: 10.6 cm. Echogenicity within normal limits. No
mass or hydronephrosis visualized.

Left Kidney: Length: 13 cm. Echogenicity within normal limits. No
mass or hydronephrosis visualized.

Abdominal aorta: No aneurysm visualized.

Other findings: None.
IMPRESSION: 1. Findings of cirrhosis. Splenomegaly with a splenic volume of
1,096 cc's.

## 2021-11-20 ENCOUNTER — Encounter: Payer: Self-pay | Admitting: Internal Medicine

## 2021-11-24 ENCOUNTER — Ambulatory Visit: Payer: Medicare Other | Admitting: Orthopaedic Surgery

## 2022-07-10 ENCOUNTER — Other Ambulatory Visit: Payer: Self-pay

## 2022-07-10 DIAGNOSIS — D61818 Other pancytopenia: Secondary | ICD-10-CM

## 2022-07-10 DIAGNOSIS — D696 Thrombocytopenia, unspecified: Secondary | ICD-10-CM

## 2022-07-12 NOTE — Assessment & Plan Note (Signed)
predominantly thrombocytopenia, secondary to liver cirrhosis, and component of ITP  -She had mild intermittent anemia and leukopenia in the past, and chronic thrombocytopenia. Bone marrow biopsy was normal. Work up revealed cirrhosis as the cause of her splenomegaly and pancytopenia. -There appears to be immune component of her thrombocytopenia, as it responded to dexamethasone in the past. She is intolerant to prednisone.  -She is clinically doing well. Labs are overall stable-- WBC 3.4, HGB 11.4, HCT 34.4, PLT 54K -Will continue to monitor her annually for now. Continue oral iron once daily

## 2022-07-13 ENCOUNTER — Ambulatory Visit (HOSPITAL_COMMUNITY)
Admission: RE | Admit: 2022-07-13 | Discharge: 2022-07-13 | Disposition: A | Discharge status: Home / Self Care | Payer: Medicare Other | Source: Ambulatory Visit | Attending: Hematology | Admitting: Hematology

## 2022-07-13 ENCOUNTER — Other Ambulatory Visit: Payer: Self-pay

## 2022-07-13 ENCOUNTER — Encounter: Payer: Self-pay | Admitting: Hematology

## 2022-07-13 ENCOUNTER — Inpatient Hospital Stay: Payer: Medicare Other | Attending: Hematology

## 2022-07-13 ENCOUNTER — Inpatient Hospital Stay: Payer: Medicare Other | Admitting: Hematology

## 2022-07-13 VITALS — BP 160/65 | HR 76 | Temp 97.5°F | Wt 167.3 lb

## 2022-07-13 DIAGNOSIS — I10 Essential (primary) hypertension: Secondary | ICD-10-CM | POA: Diagnosis not present

## 2022-07-13 DIAGNOSIS — R300 Dysuria: Secondary | ICD-10-CM

## 2022-07-13 DIAGNOSIS — R350 Frequency of micturition: Secondary | ICD-10-CM | POA: Insufficient documentation

## 2022-07-13 DIAGNOSIS — D693 Immune thrombocytopenic purpura: Secondary | ICD-10-CM | POA: Diagnosis not present

## 2022-07-13 DIAGNOSIS — D696 Thrombocytopenia, unspecified: Secondary | ICD-10-CM

## 2022-07-13 DIAGNOSIS — E119 Type 2 diabetes mellitus without complications: Secondary | ICD-10-CM | POA: Insufficient documentation

## 2022-07-13 DIAGNOSIS — D61818 Other pancytopenia: Secondary | ICD-10-CM | POA: Insufficient documentation

## 2022-07-13 DIAGNOSIS — Z85828 Personal history of other malignant neoplasm of skin: Secondary | ICD-10-CM | POA: Insufficient documentation

## 2022-07-13 DIAGNOSIS — K7460 Unspecified cirrhosis of liver: Secondary | ICD-10-CM | POA: Insufficient documentation

## 2022-07-13 LAB — URINALYSIS, COMPLETE (UACMP) WITH MICROSCOPIC
Bilirubin Urine: NEGATIVE
Glucose, UA: NEGATIVE mg/dL
Hgb urine dipstick: NEGATIVE
Ketones, ur: NEGATIVE mg/dL
Nitrite: NEGATIVE
Protein, ur: NEGATIVE mg/dL
Specific Gravity, Urine: 1.013 (ref 1.005–1.030)
WBC, UA: >50 WBC/hpf (ref 0–5)
pH: 5 (ref 5.0–8.0)

## 2022-07-13 LAB — CBC WITH DIFFERENTIAL (CANCER CENTER ONLY)
Abs Immature Granulocytes: 0.01 10*3/uL (ref 0.00–0.07)
Basophils Absolute: 0 10*3/uL (ref 0.0–0.1)
Basophils Relative: 0 %
Eosinophils Absolute: 0.1 10*3/uL (ref 0.0–0.5)
Eosinophils Relative: 4 %
HCT: 34.2 % — ABNORMAL LOW (ref 36.0–46.0)
Hemoglobin: 10.8 g/dL — ABNORMAL LOW (ref 12.0–15.0)
Immature Granulocytes: 0 %
Lymphocytes Relative: 22 %
Lymphs Abs: 0.6 10*3/uL — ABNORMAL LOW (ref 0.7–4.0)
MCH: 28.6 pg (ref 26.0–34.0)
MCHC: 31.6 g/dL (ref 30.0–36.0)
MCV: 90.7 fL (ref 80.0–100.0)
Monocytes Absolute: 0.3 10*3/uL (ref 0.1–1.0)
Monocytes Relative: 12 %
Neutro Abs: 1.6 10*3/uL — ABNORMAL LOW (ref 1.7–7.7)
Neutrophils Relative %: 62 %
Platelet Count: 49 10*3/uL — ABNORMAL LOW (ref 150–400)
RBC: 3.77 MIL/uL — ABNORMAL LOW (ref 3.87–5.11)
RDW: 13.8 % (ref 11.5–15.5)
WBC Count: 2.6 10*3/uL — ABNORMAL LOW (ref 4.0–10.5)
nRBC: 0 % (ref 0.0–0.2)

## 2022-07-13 LAB — CMP (CANCER CENTER ONLY)
ALT: 18 U/L (ref 0–44)
AST: 27 U/L (ref 15–41)
Albumin: 3.9 g/dL (ref 3.5–5.0)
Alkaline Phosphatase: 77 U/L (ref 38–126)
Anion gap: 5 (ref 5–15)
BUN: 17 mg/dL (ref 8–23)
CO2: 28 mmol/L (ref 22–32)
Calcium: 9.3 mg/dL (ref 8.9–10.3)
Chloride: 108 mmol/L (ref 98–111)
Creatinine: 0.8 mg/dL (ref 0.44–1.00)
GFR, Estimated: >60 mL/min (ref >60)
Glucose, Bld: 160 mg/dL — ABNORMAL HIGH (ref 70–99)
Potassium: 4.4 mmol/L (ref 3.5–5.1)
Sodium: 141 mmol/L (ref 135–145)
Total Bilirubin: 1.1 mg/dL (ref 0.3–1.2)
Total Protein: 6.1 g/dL — ABNORMAL LOW (ref 6.5–8.1)

## 2022-07-13 LAB — IRON AND IRON BINDING CAPACITY (CC-WL,HP ONLY)
Iron: 78 ug/dL (ref 28–170)
Saturation Ratios: 23 % (ref 10.4–31.8)
TIBC: 342 ug/dL (ref 250–450)
UIBC: 264 ug/dL (ref 148–442)

## 2022-07-13 LAB — FERRITIN: Ferritin: 49 ng/mL (ref 11–307)

## 2022-07-13 NOTE — Progress Notes (Signed)
[facility]   Telephone:(336) [phone] [phone]   Clinic Follow up Note   Patient Care Team: Elise Benne, MD as PCP - General (Internal Medicine) Levert Feinstein, MD as Consulting Physician (Oncology) Altheimer, Casimiro Needle, MD as Consulting Physician (Endocrinology) Jeani Hawking, MD as Consulting Physician (Gastroenterology) Hilarie Fredrickson, MD as Consulting Physician (Gastroenterology) Malachy Mood, MD as Consulting Physician (Hematology) Pollyann Samples, NP as Nurse Practitioner (Nurse Practitioner)  Date of Service:  07/13/2022  CHIEF COMPLAINT: f/u of  pancytopenia   CURRENT THERAPY:  Oral iron once daily, starting 11/03/19   ASSESSMENT:  Yvonne Carrillo is a 77 y.o. female with   Pancytopenia (HCC)  predominantly thrombocytopenia, secondary to liver cirrhosis, and component of ITP  -She had mild intermittent anemia and leukopenia in the past, and chronic thrombocytopenia. Bone marrow biopsy was normal. Work up revealed cirrhosis as the cause of her splenomegaly and pancytopenia. -There appears to be immune component of her thrombocytopenia, as it responded to dexamethasone in the past. She is intolerant to prednisone.  -She is clinically doing well. Labs are overall stable-- WBC 3.4, HGB 11.4, HCT 34.4, PLT 54K -Will continue to monitor her annually for now. Continue oral iron once daily     2. Liver cirrhosis, cryptogenic  -She underwent very thorough work up by Dr. Cyndie Chime which was essentially unremarkable, the cause for her liver cirrhosis remains unknown. -CT in 2016 showed esophageal varices and findings consistent with portal venous hypertension -upper endoscopy on 10/26/18 by Dr. Marina Goodell showed 2-3+ varices in the lower third esophagus moderate portal hypertensive gastropathy. In the entire stomach. She was started on nadolol and oral iron.  -pt had screening abdominal ultrasound today, 07/11/22, result is still pending. Will repeat in 1  year.   3. DM, HTN, Cancer Screenings -Her sugar has been controlled, on Janumet -Last colonoscopy was 2018 and Endoscopy in 2020 -She continues to do Mammogram yearly in [place], [place]   PLAN: -lab reviewed -platelet 49, wbc 2.6, Neutrophil 1.6 -I order a UA and a urine culture today, she has history of interstitial cystitis -Continue Oral Iron -Continue to monitor annually -lab and abdominal ultrasound, f/u with a phone visit. -I will call her with [place] and lab results from today    INTERVAL HISTORY:  Yvonne Carrillo is here for a follow up of  pancytopenia . She was last seen by me on 07/10/2021. She presents to the clinic alone. Pt denies having any issues. Pt is overall clinically doing well. Pt state that she has some bruising. Pt wanted to get tested for a UTI. She has some urination frequency and some burning when she urinates.   All other systems were reviewed with the patient and are negative.  MEDICAL HISTORY:  Past Medical History:  Diagnosis Date   Anemia    not current   Cancer (HCC)    basal and squamous cell carcinoma   Chronic interstitial cystitis 03/05/2014   Diabetes mellitus without complication (HCC)    Hepatic cirrhosis (HCC) 05/08/2014   Hyperlipidemia    Hypertension    Laryngopharyngeal reflux (LPR)    Osteopenia    Thrombocytopenia (HCC) 03/05/2014   platelet count runs low and very low at times    SURGICAL HISTORY: Past Surgical History:  Procedure Laterality Date   BRAIN MENINGIOMA EXCISION     COLONOSCOPY     LAMINECTOMY     PARTIAL HYSTERECTOMY     1993   POLYPECTOMY     [name]  in [place], [place]    I have reviewed the social history and family history with the patient and they are unchanged from previous note.  ALLERGIES:  is allergic to epinephrine, lisinopril, and penicillins.  MEDICATIONS:  Current Outpatient Medications  Medication Sig Dispense Refill   augmented betamethasone dipropionate (DIPROLENE-AF) 0.05 % cream Apply 1  Application topically as needed.     Calcium Carbonate-Vitamin D (CALCIUM-VITAMIN D) 600-125 MG-UNIT TABS Take by mouth daily.     cholecalciferol (VITAMIN D) 400 UNITS TABS tablet Take 2,000 Units by mouth daily.     Ferrous Sulfate Dried (HIGH POTENCY IRON) 65 MG TABS Take 1 tablet by mouth daily.     irbesartan (AVAPRO) 75 MG tablet Take 75 mg by mouth daily.     Lancets (ONETOUCH DELICA PLUS LANCET30G) MISC as directed.     metFORMIN (GLUCOPHAGE) 500 MG tablet Take 1 tablet by mouth daily. Qd     nadolol (CORGARD) 20 MG tablet Take 1 tablet (20 mg total) by mouth daily. 90 tablet 3   ONETOUCH VERIO test strip 1 each 2 (two) times daily.     psyllium (METAMUCIL) 58.6 % powder Take 1 packet by mouth as needed. 3 times a week as needed     simvastatin (ZOCOR) 20 MG tablet Take 1 tablet by mouth daily.     sitaGLIPtin-metformin (JANUMET) 50-1000 MG per tablet Take 1 tablet by mouth daily.      tamsulosin (FLOMAX) 0.4 MG CAPS capsule Take 0.4 mg by mouth daily.     No current facility-administered medications for this visit.    PHYSICAL EXAMINATION: ECOG PERFORMANCE STATUS: 0 - Asymptomatic  Vitals:   07/13/22 1153  BP: (!) 160/65  Pulse: 76  Temp: (!) 97.5 F (36.4 C)  SpO2: 99%   Wt Readings from Last 3 Encounters:  07/13/22 167 lb 4.8 oz (75.9 kg)  09/19/21 174 lb 3.2 oz (79 kg)  07/10/21 173 lb 6.4 oz (78.7 kg)     GENERAL:alert, no distress and comfortable SKIN: skin color normal, no rashes or significant lesions EYES: normal, Conjunctiva are pink and non-injected, sclera clear  NEURO: alert & oriented x 3 with fluent speech ABDOMEN(-) :abdomen soft,(-) non-tender and (-) normal bowel sounds Musculoskeletal:no cyanosis of digits and no clubbing  NEURO: alert & oriented x 3 with fluent speech, no focal motor/sensory deficits  LABORATORY DATA:  I have reviewed the data as listed    Latest Ref Rng & Units 07/13/2022    8:18 AM 07/10/2021    9:18 AM 07/11/2020   11:04 AM   CBC  WBC 4.0 - 10.5 K/uL 2.6  3.4  3.4   Hemoglobin 12.0 - 15.0 g/dL 98.1  19.1  47.8   Hematocrit 36.0 - 46.0 % 34.2  34.4  34.8   Platelets 150 - 400 K/uL 49  54  60         Latest Ref Rng & Units 07/13/2022    8:18 AM 07/10/2021    9:18 AM 07/11/2020   11:04 AM  CMP  Glucose 70 - 99 mg/dL 295  621  308   BUN 8 - 23 mg/dL 17  17  13    Creatinine 0.44 - 1.00 mg/dL 6.57  8.46  9.62   Sodium 135 - 145 mmol/L 141  139  142   Potassium 3.5 - 5.1 mmol/L 4.4  4.8  4.4   Chloride 98 - 111 mmol/L 108  106  107   CO2 22 - 32 mmol/L  28  28  27    Calcium 8.9 - 10.3 mg/dL 9.3  9.8  9.5   Total Protein 6.5 - 8.1 g/dL 6.1  6.9  6.8   Total Bilirubin 0.3 - 1.2 mg/dL 1.1  1.1  1.1   Alkaline Phos 38 - 126 U/L 77  77  77   AST 15 - 41 U/L 27  36  24   ALT 0 - 44 U/L 18  20  14        RADIOGRAPHIC STUDIES: I have personally reviewed the radiological images as listed and agreed with the findings in the report. US Abdomen Complete  Result Date: 07/13/2022 CLINICAL DATA:  HCC survelliance EXAM: ABDOMEN ULTRASOUND COMPLETE COMPARISON:  Ultrasound from 07/10/2021. FINDINGS: Liver: There is liver surface irregularity/nodularity and heterogeneous echotexture, compatible with cirrhosis. No focal mass seen on the provided images. The main portal vein is patent with appropriate direction flow on color Doppler. Gallbladder: Normal. No gallstones, sludge, or pericholecystic fluid. No wall thickening. No sonographic Murphy sign. Bile ducts: No intrahepatic or extrahepatic bile duct dilation. Extrahepatic bile duct diameter:  <4 mm. Pancreas: Visualized portions are unremarkable. Portions of the head and tail are obscured by bowel gas. Kidneys: Normal in size. Normal in echotexture. No hydronephrosis. No renal cysts, contour deforming mass or stones large enough to cause acoustic shadowing. Right kidney length: 10.9 cm. Left kidney length: 12.4 cm. Spleen: Enlarged in size, grossly similar to the prior study.  Normal in echotexture. No focal lesion. Spleen length: 7.1 x 17.5 x 17.6 cm. Aorta and inferior vena cava: Visualized portions are normal in caliber. Ascites: None. IMPRESSION: *Redemonstration of cirrhotic liver configuration.  No focal mass. *Persistent marked splenomegaly. *Other observations, as described above. Electronically Signed   By: Jules Schick M.D.   On: 07/13/2022 14:45      Orders Placed This Encounter  Procedures   Urine Culture    Standing Status:   Future    Number of Occurrences:   1    Standing Expiration Date:   07/13/2023   US Abdomen Complete    Standing Status:   Future    Standing Expiration Date:   07/13/2023    Order Specific Question:   Reason for Exam (SYMPTOM  OR DIAGNOSIS REQUIRED)    Answer:   [facility] surveillance    Order Specific Question:   Preferred imaging location?    Answer:   [facility]    Order Specific Question:   Release to patient    Answer:   Immediate   Urinalysis, Complete w Microscopic    Standing Status:   Future    Number of Occurrences:   1    Standing Expiration Date:   07/13/2023   All questions were answered. The patient knows to call the clinic with any problems, questions or concerns. No barriers to learning was detected. The total time spent in the appointment was 25 minutes.     Malachy Mood, MD 07/13/2022   Carolin Coy, CMA, am acting as scribe for Malachy Mood, MD.   I have reviewed the above documentation for accuracy and completeness, and I agree with the above.

## 2022-07-14 ENCOUNTER — Encounter: Payer: Self-pay | Admitting: Hematology

## 2022-07-14 LAB — URINE CULTURE: Culture: >=100000 — AB

## 2022-07-15 ENCOUNTER — Other Ambulatory Visit: Payer: Self-pay | Admitting: Nurse Practitioner

## 2022-07-15 DIAGNOSIS — A491 Streptococcal infection, unspecified site: Secondary | ICD-10-CM

## 2022-07-15 LAB — AFP TUMOR MARKER: AFP, Serum, Tumor Marker: <1.8 ng/mL (ref 0.0–9.2)

## 2022-07-15 MED ORDER — NITROFURANTOIN MONOHYD MACRO 100 MG PO CAPS
100.0000 mg | ORAL_CAPSULE | Freq: Two times a day (BID) | ORAL | 0 refills | Status: AC
Start: 2022-07-15 — End: ?

## 2022-07-27 ENCOUNTER — Telehealth: Payer: Self-pay

## 2022-07-27 NOTE — Telephone Encounter (Addendum)
Called patient to relay message below, asked how she is doing, she stated she is feeling much better. Patient voiced full understanding and stated she didn't need another antibiotic.   ----- Message from Malachy Mood, MD sent at 07/26/2022  5:11 PM EDT ----- Please let pt know her urine culture results, I am not sure if it is contamination vs true infection. If she is still symptomatic, please call in augmentin 875mg  bid for 5 days for her, thx   Malachy Mood  07/26/2022

## 2022-10-06 IMAGING — US US ABDOMEN COMPLETE
1 series · 15 of 25 positions shown · non-contrast
Comparison: Abdominal ultrasound 07/11/2020

CLINICAL DATA: Cirrhosis

EXAM:
ABDOMEN ULTRASOUND COMPLETE

[Series 1: us abdomen complete mc & wl · 15 of 99 slices shown]
[im 1/99]
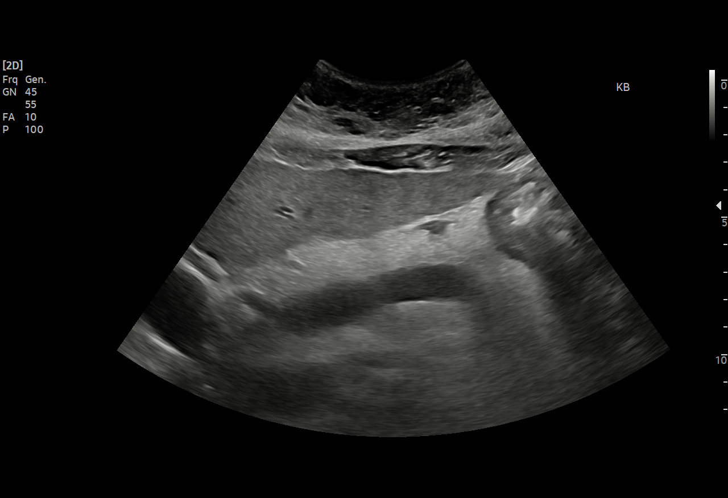
[im 9/99]
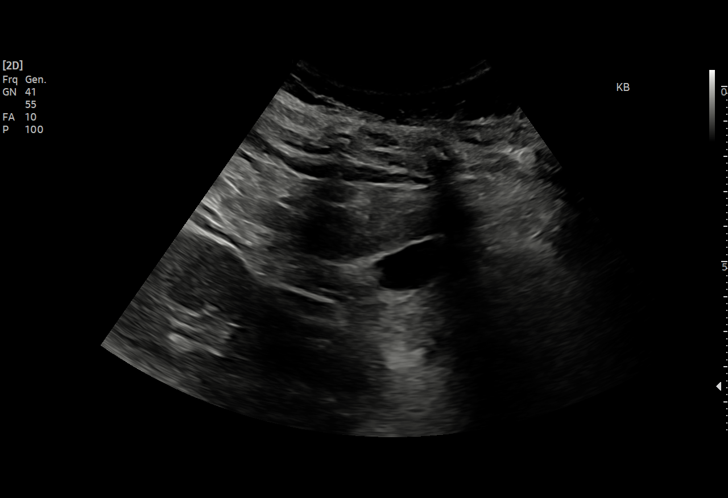
[im 17/99]
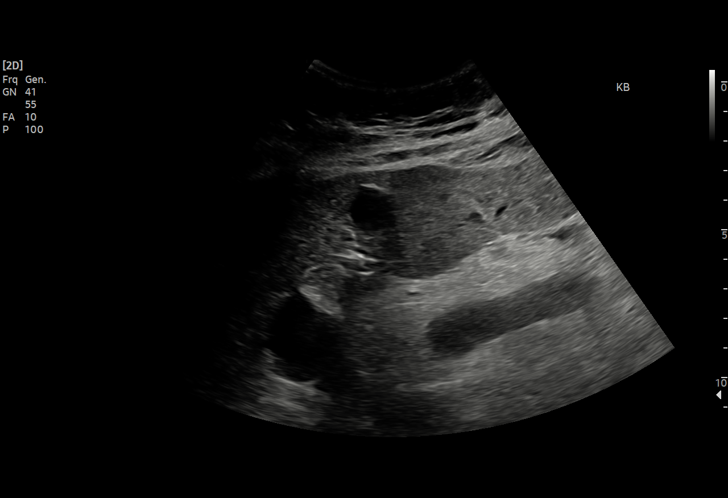
[im 21/99]
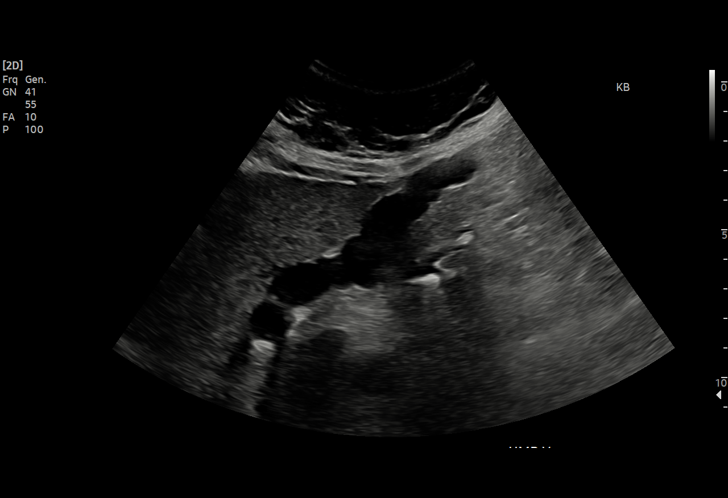
[im 29/99]
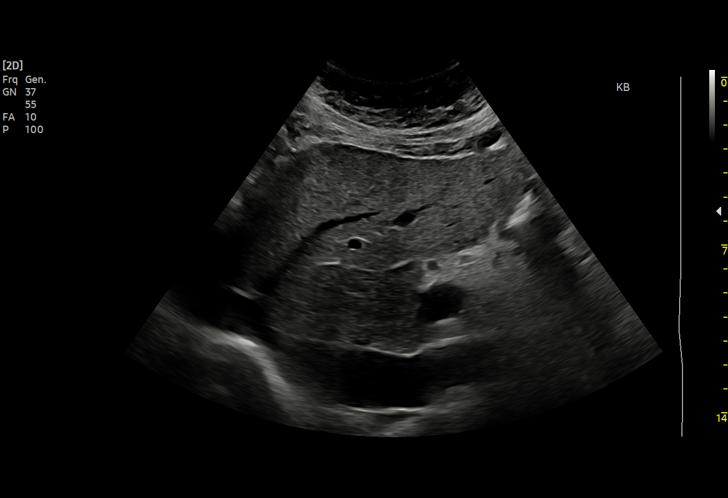
[im 37/99]
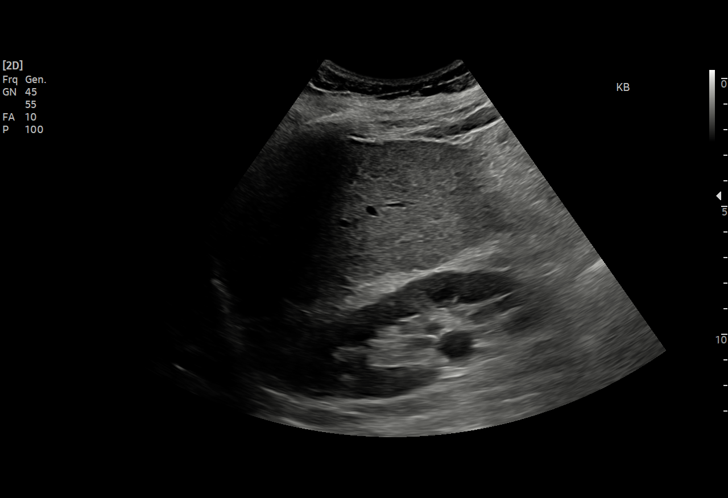
[im 41/99]
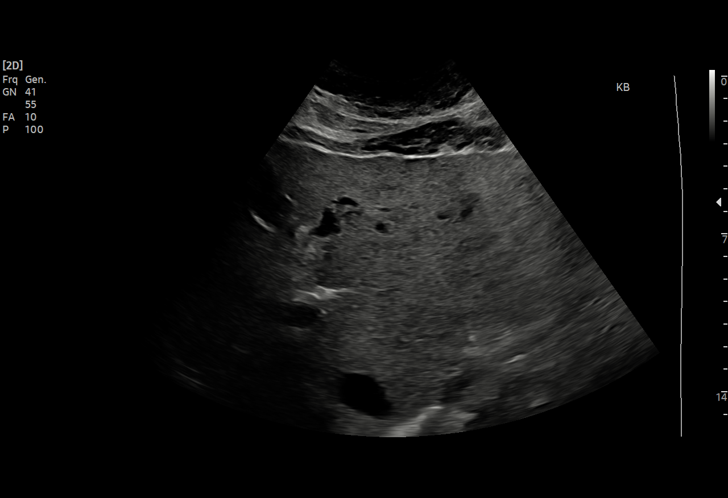
[im 50/99]
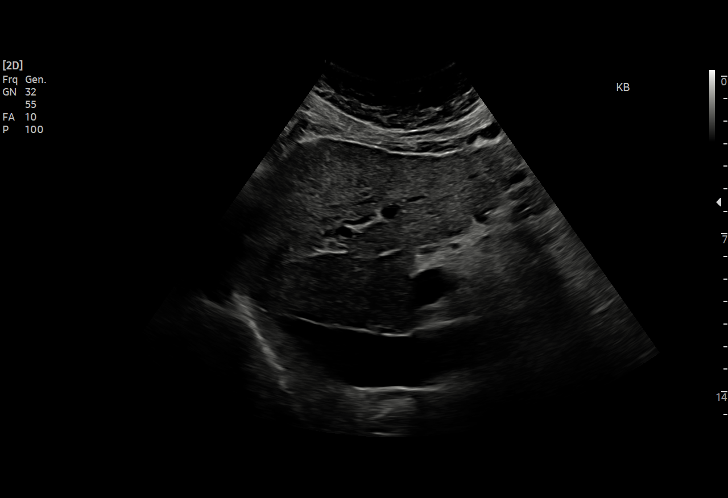
[im 58/99]
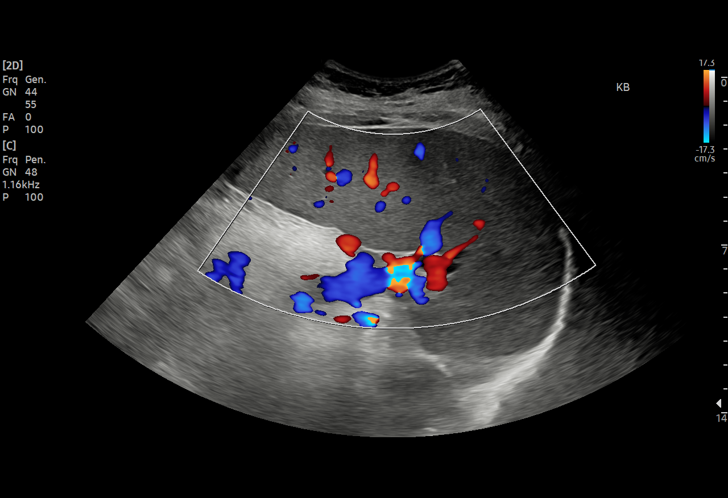
[im 62/99]
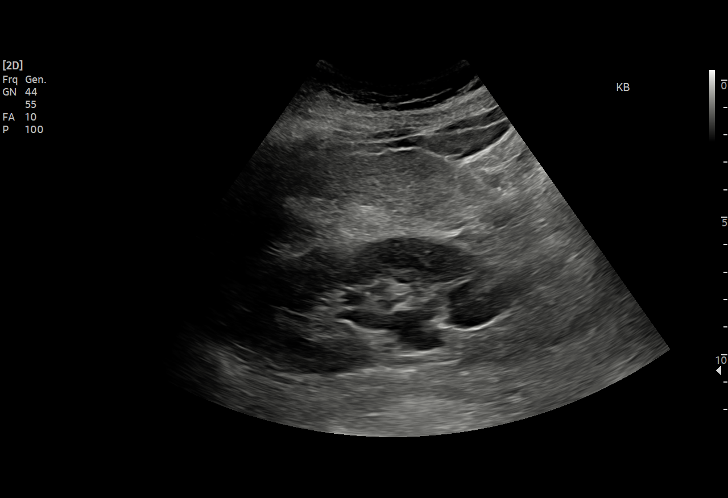
[im 70/99]
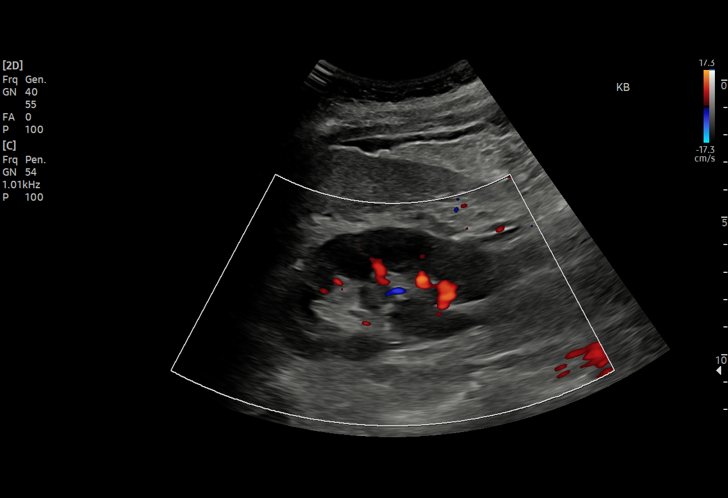
[im 78/99]
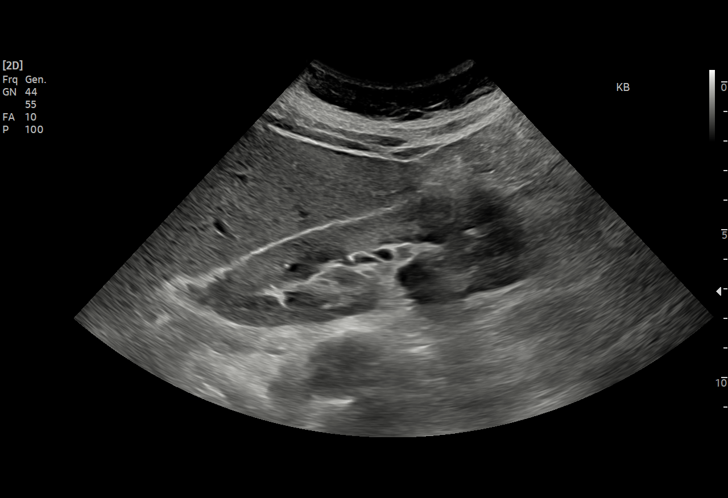
[im 82/99]
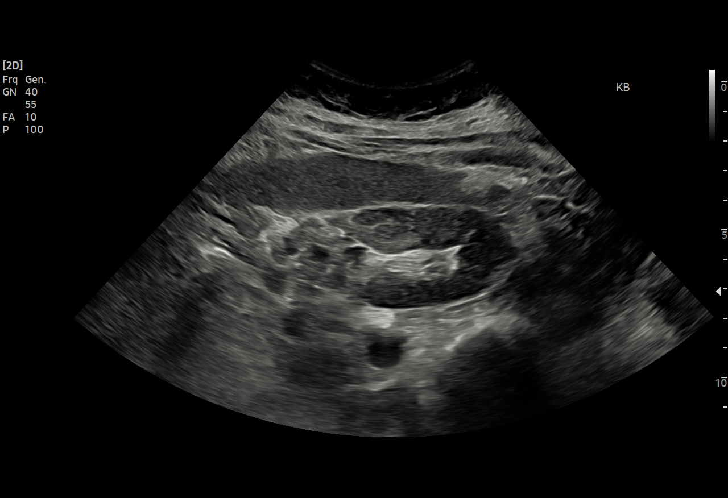
[im 90/99]
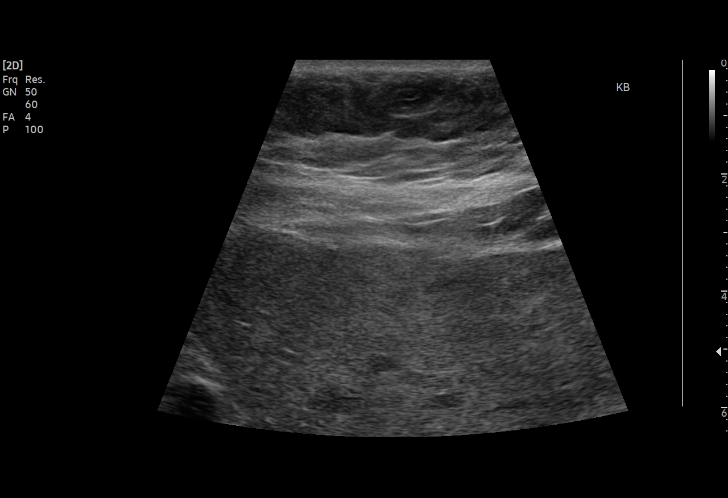
[im 99/99]
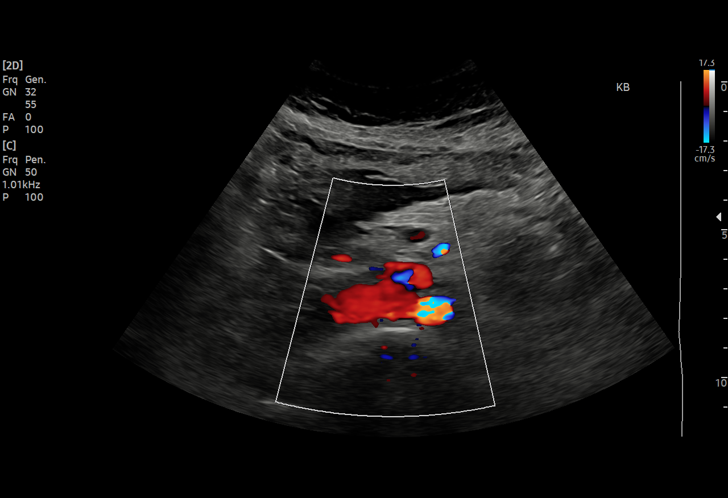

[15 of 25 positions shown; findings below may reference images not displayed]

FINDINGS: Gallbladder: No gallstones or wall thickening visualized. No
sonographic Murphy sign noted by sonographer.

Common bile duct: Diameter: 4 mm

Liver: Nodular contour with inhomogeneous increased parenchymal
echogenicity. No focal hepatic mass identified. Note is made of
prominent tortuous recanalization of the umbilical vein. Portal vein
is patent on color Doppler imaging with normal direction of blood
flow towards the liver.

IVC: No abnormality visualized.

Pancreas: Visualized portion unremarkable.

Spleen: Enlarged measuring 17.6 cm in length.

Right Kidney: Length: 10.3 cm. Echogenicity within normal limits. No
mass or hydronephrosis visualized.

Left Kidney: Length: 12.5 cm. Echogenicity within normal limits. No
mass or hydronephrosis visualized.

Abdominal aorta: No aneurysm visualized.

Other findings: None.
IMPRESSION: 1. Evidence of hepatic cirrhosis with no hepatic mass identified.
2. Marked splenomegaly and recanalization of the umbilical vein,
suggesting portal hypertension.

## 2023-03-05 ENCOUNTER — Other Ambulatory Visit: Payer: Self-pay

## 2023-03-05 ENCOUNTER — Telehealth: Payer: Self-pay

## 2023-03-05 NOTE — Telephone Encounter (Signed)
 Pt called requesting to have her appts scheduled in June to be rescheduled to a later date. Sent scheduling message to have pt's appts rescheduled.

## 2023-03-11 ENCOUNTER — Telehealth: Payer: Self-pay | Admitting: Hematology

## 2023-03-11 NOTE — Telephone Encounter (Signed)
Patient is aware of changed provider for visit scheduled in June 2025

## 2023-05-15 ENCOUNTER — Telehealth: Payer: Self-pay | Admitting: Nurse Practitioner

## 2023-05-15 NOTE — Telephone Encounter (Signed)
 Patient called answering service.   Feels constipation. Not having much stool output. Takes daily Miralax which generally works   Passed clear mucous from rectum this am    No fever

## 2023-05-15 NOTE — Telephone Encounter (Signed)
 Yvonne Carrillo is followed by Dr. Elvin Hammer for history of cryptogenic cirrhosis with portal hypertension with thrombocytopenia, splenomegaly and esophageal varices.  She also has history of chronic constipation and colon polyps .  She has not been seen in the office since August 2023  Yvonne Carrillo called the answering service today with complaints of crampy LLQ pain and worsening of her chronic constipation.  She passed a small amount of nonbloody mucus today . She hasn't had any significant stool output in few days.  Otherwise she feels okay.  No fevers.  She typically takes daily MiraLAX which has historically worked okay for her.  She is concerned she could have diverticulitis and put herself on a clear liquid.  She has no prior history of diverticulitis.  Recommendation:  Since Yvonne Carrillo has several health issues and has not been seen in the office for over 1.5 years I do not feel comfortable treating her empirically for diverticulitis over the telephone Advised Yvonne Carrillo to increase MiraLAX to twice daily and a Dulcolax enema.  I think it makes sense to first rule out constipation as cause for her symptoms.   If pain persists after the resolution of constipation then we could possibly treat empirically for diverticulitis but will defer to her primary GI Dr. Elvin Hammer.  I asked patient to call the office on Monday with a condition update.  Certainly she should call back over the weekend if things get worse or if develops fever

## 2023-05-16 NOTE — Telephone Encounter (Signed)
 Thank you Maureen Sour. Yvonne Carrillo, please check on her Monday morning.Thanks

## 2023-05-17 NOTE — Telephone Encounter (Signed)
 Left message for pt to call back

## 2023-05-17 NOTE — Telephone Encounter (Signed)
 PT returning call. Asking that we call her mobile number

## 2023-05-18 NOTE — Telephone Encounter (Signed)
 Spoke with pt and she states she is better. She knows to call the office with any further issues.

## 2023-06-30 ENCOUNTER — Other Ambulatory Visit: Payer: Self-pay

## 2023-06-30 ENCOUNTER — Telehealth: Payer: Self-pay | Admitting: Hematology

## 2023-06-30 ENCOUNTER — Other Ambulatory Visit: Payer: Self-pay | Admitting: Hematology

## 2023-06-30 DIAGNOSIS — D61818 Other pancytopenia: Secondary | ICD-10-CM

## 2023-07-06 ENCOUNTER — Ambulatory Visit (HOSPITAL_COMMUNITY): Payer: Medicare Other

## 2023-07-06 ENCOUNTER — Other Ambulatory Visit: Payer: Medicare Other

## 2023-07-12 ENCOUNTER — Telehealth: Payer: Medicare Other | Admitting: Nurse Practitioner

## 2023-07-19 ENCOUNTER — Telehealth: Payer: Self-pay | Admitting: Internal Medicine

## 2023-07-19 ENCOUNTER — Telehealth: Payer: Medicare Other | Admitting: Hematology

## 2023-07-19 NOTE — Telephone Encounter (Signed)
 Inbound call from patient wanting to inform that she will follow up with primary care provider instead regarding previous note. Please advise, thank you

## 2023-07-19 NOTE — Telephone Encounter (Signed)
 Noted

## 2023-07-19 NOTE — Telephone Encounter (Signed)
 Patient called and stated that she was having a flare up of Diverticulitis and was experiencing rectal bleeding along with constipation and a lot of mucus. Patient is requesting to speak to the nurse. Please advise.

## 2023-08-05 ENCOUNTER — Other Ambulatory Visit: Payer: Self-pay

## 2023-08-05 ENCOUNTER — Telehealth: Payer: Self-pay

## 2023-08-05 NOTE — Telephone Encounter (Signed)
 Pt called to cancel her lab appt scheduled prior to her US  Abdomen and telephone visit with Dr. Lanny.  Pt stated she is scheduled to see her PCP prior to these appts which will draw a CBC.  Pt's PCP stated no need for pt to keep appt with Dr. Demetra office because he will be drawing similar labs.  Stated Dr. Lanny draws a tumor marker as well as Anemia labs which the PCP does not.  Pt is scheduled for US  Abdomen to be done at [facility] as well as labs this Day.  Requested if the PCP would contact Dr. Demetra office.  Pt will contact her PCP for them to contact Dr. Demetra office to get lab orders.  Pt stated she would contact her PCP's office to contact Dr. Lanny.

## 2023-08-06 ENCOUNTER — Other Ambulatory Visit: Payer: Self-pay

## 2023-08-06 DIAGNOSIS — D696 Thrombocytopenia, unspecified: Secondary | ICD-10-CM

## 2023-08-06 DIAGNOSIS — E7800 Pure hypercholesterolemia, unspecified: Secondary | ICD-10-CM

## 2023-08-06 DIAGNOSIS — E1122 Type 2 diabetes mellitus with diabetic chronic kidney disease: Secondary | ICD-10-CM

## 2023-08-06 DIAGNOSIS — I129 Hypertensive chronic kidney disease with stage 1 through stage 4 chronic kidney disease, or unspecified chronic kidney disease: Secondary | ICD-10-CM

## 2023-08-06 DIAGNOSIS — K7460 Unspecified cirrhosis of liver: Secondary | ICD-10-CM

## 2023-08-06 NOTE — Progress Notes (Signed)
 [facility] [place] Dr. Laurance Colt faxed Dr. Demetra office lab orders for the pt to be drawn when pt comes in on 08/11/2023.  Dr. Lanny approved to have labs drawn.  Labs entered in [place].  Will fax results to Dr. Thresea office once they have resulted.

## 2023-08-11 ENCOUNTER — Inpatient Hospital Stay: Attending: Hematology

## 2023-08-11 ENCOUNTER — Ambulatory Visit (HOSPITAL_COMMUNITY)
Admission: RE | Admit: 2023-08-11 | Discharge: 2023-08-11 | Disposition: A | Discharge status: Home / Self Care | Source: Ambulatory Visit | Attending: Hematology | Admitting: Hematology

## 2023-08-11 DIAGNOSIS — I129 Hypertensive chronic kidney disease with stage 1 through stage 4 chronic kidney disease, or unspecified chronic kidney disease: Secondary | ICD-10-CM | POA: Diagnosis not present

## 2023-08-11 DIAGNOSIS — D61818 Other pancytopenia: Secondary | ICD-10-CM | POA: Insufficient documentation

## 2023-08-11 DIAGNOSIS — D649 Anemia, unspecified: Secondary | ICD-10-CM | POA: Diagnosis not present

## 2023-08-11 DIAGNOSIS — I868 Varicose veins of other specified sites: Secondary | ICD-10-CM | POA: Insufficient documentation

## 2023-08-11 DIAGNOSIS — E7800 Pure hypercholesterolemia, unspecified: Secondary | ICD-10-CM | POA: Diagnosis not present

## 2023-08-11 DIAGNOSIS — D693 Immune thrombocytopenic purpura: Secondary | ICD-10-CM | POA: Diagnosis not present

## 2023-08-11 DIAGNOSIS — E1122 Type 2 diabetes mellitus with diabetic chronic kidney disease: Secondary | ICD-10-CM | POA: Diagnosis not present

## 2023-08-11 DIAGNOSIS — D696 Thrombocytopenia, unspecified: Secondary | ICD-10-CM | POA: Diagnosis not present

## 2023-08-11 DIAGNOSIS — K7460 Unspecified cirrhosis of liver: Secondary | ICD-10-CM | POA: Insufficient documentation

## 2023-08-11 LAB — LIPID PANEL
Cholesterol: 137 mg/dL (ref 0–200)
HDL: 50 mg/dL (ref >40)
LDL Cholesterol: 69 mg/dL (ref 0–99)
Total CHOL/HDL Ratio: 2.7 ratio
Triglycerides: 92 mg/dL (ref <150)
VLDL: 18 mg/dL (ref 0–40)

## 2023-08-11 LAB — IRON AND IRON BINDING CAPACITY (CC-WL,HP ONLY)
Iron: 99 ug/dL (ref 28–170)
Saturation Ratios: 30 % (ref 10.4–31.8)
TIBC: 330 ug/dL (ref 250–450)
UIBC: 231 ug/dL (ref 148–442)

## 2023-08-11 LAB — CBC WITH DIFFERENTIAL (CANCER CENTER ONLY)
Abs Immature Granulocytes: 0 K/uL (ref 0.00–0.07)
Basophils Absolute: 0 K/uL (ref 0.0–0.1)
Basophils Relative: 0 %
Eosinophils Absolute: 0.1 K/uL (ref 0.0–0.5)
Eosinophils Relative: 2 %
HCT: 32.5 % — ABNORMAL LOW (ref 36.0–46.0)
Hemoglobin: 11 g/dL — ABNORMAL LOW (ref 12.0–15.0)
Immature Granulocytes: 0 %
Lymphocytes Relative: 16 %
Lymphs Abs: 0.4 K/uL — ABNORMAL LOW (ref 0.7–4.0)
MCH: 29.5 pg (ref 26.0–34.0)
MCHC: 33.8 g/dL (ref 30.0–36.0)
MCV: 87.1 fL (ref 80.0–100.0)
Monocytes Absolute: 0.4 K/uL (ref 0.1–1.0)
Monocytes Relative: 13 %
Neutro Abs: 1.8 K/uL (ref 1.7–7.7)
Neutrophils Relative %: 69 %
Platelet Count: 51 K/uL — ABNORMAL LOW (ref 150–400)
RBC: 3.73 MIL/uL — ABNORMAL LOW (ref 3.87–5.11)
RDW: 13.6 % (ref 11.5–15.5)
WBC Count: 2.7 K/uL — ABNORMAL LOW (ref 4.0–10.5)
nRBC: 0 % (ref 0.0–0.2)

## 2023-08-11 LAB — CMP (CANCER CENTER ONLY)
ALT: 14 U/L (ref 0–44)
AST: 24 U/L (ref 15–41)
Albumin: 4.1 g/dL (ref 3.5–5.0)
Alkaline Phosphatase: 70 U/L (ref 38–126)
Anion gap: 7 (ref 5–15)
BUN: 15 mg/dL (ref 8–23)
CO2: 28 mmol/L (ref 22–32)
Calcium: 9.6 mg/dL (ref 8.9–10.3)
Chloride: 103 mmol/L (ref 98–111)
Creatinine: 0.76 mg/dL (ref 0.44–1.00)
GFR, Estimated: >60 mL/min (ref >60)
Glucose, Bld: 137 mg/dL — ABNORMAL HIGH (ref 70–99)
Potassium: 4.4 mmol/L (ref 3.5–5.1)
Sodium: 138 mmol/L (ref 135–145)
Total Bilirubin: 1.4 mg/dL — ABNORMAL HIGH (ref 0.0–1.2)
Total Protein: 6.7 g/dL (ref 6.5–8.1)

## 2023-08-11 LAB — MAGNESIUM: Magnesium: 1.9 mg/dL (ref 1.7–2.4)

## 2023-08-11 LAB — URINALYSIS, COMPLETE (UACMP) WITH MICROSCOPIC
Bilirubin Urine: NEGATIVE
Glucose, UA: NEGATIVE mg/dL
Hgb urine dipstick: NEGATIVE
Ketones, ur: NEGATIVE mg/dL
Nitrite: NEGATIVE
Protein, ur: NEGATIVE mg/dL
Specific Gravity, Urine: 1.006 (ref 1.005–1.030)
pH: 7 (ref 5.0–8.0)

## 2023-08-11 LAB — FERRITIN: Ferritin: 143 ng/mL (ref 11–307)

## 2023-08-11 LAB — HEMOGLOBIN A1C
Hgb A1c MFr Bld: 7.3 % — ABNORMAL HIGH (ref 4.8–5.6)
Mean Plasma Glucose: 162.81 mg/dL

## 2023-08-11 LAB — URIC ACID: Uric Acid, Serum: 4.6 mg/dL (ref 2.5–7.1)

## 2023-08-12 LAB — MICROALBUMIN / CREATININE URINE RATIO
Creatinine, Urine: 32.1 mg/dL
Microalb Creat Ratio: 120 mg/g{creat} — ABNORMAL HIGH (ref 0–29)
Microalb, Ur: 38.6 ug/mL — ABNORMAL HIGH

## 2023-08-12 LAB — HIGH SENSITIVITY CRP: CRP, High Sensitivity: 0.96 mg/L (ref 0.00–3.00)

## 2023-08-17 ENCOUNTER — Other Ambulatory Visit: Payer: Self-pay

## 2023-08-17 LAB — AFP TUMOR MARKER: AFP, Serum, Tumor Marker: <1.8 ng/mL (ref 0.0–9.2)

## 2023-08-17 NOTE — Progress Notes (Signed)
 Faxed pt's recent lab results to [facility] w/[place] [facility]) [phone]  [phone] ordering provider Dr. Laurance MARLA Rafter.  Fax confirmation received.

## 2023-08-17 NOTE — Assessment & Plan Note (Signed)
predominantly thrombocytopenia, secondary to liver cirrhosis, and component of ITP  -She had mild intermittent anemia and leukopenia in the past, and chronic thrombocytopenia. Bone marrow biopsy was normal. Work up revealed cirrhosis as the cause of her splenomegaly and pancytopenia. -There appears to be immune component of her thrombocytopenia, as it responded to dexamethasone in the past. She is intolerant to prednisone.  -She is clinically doing well. Labs are overall stable-- WBC 3.4, HGB 11.4, HCT 34.4, PLT 54K -Will continue to monitor her annually for now. Continue oral iron once daily

## 2023-08-18 ENCOUNTER — Inpatient Hospital Stay (HOSPITAL_BASED_OUTPATIENT_CLINIC_OR_DEPARTMENT_OTHER): Admitting: Hematology

## 2023-08-18 DIAGNOSIS — D61818 Other pancytopenia: Secondary | ICD-10-CM | POA: Diagnosis not present

## 2023-08-18 NOTE — Progress Notes (Signed)
 [facility]   [phone] Fax:([phone]   Clinic Follow up Note   Patient Care Team: Toy Laurance POUR, MD as PCP - General (Internal Medicine) Freddie Lynwood HERO, MD as Consulting Physician (Oncology) Altheimer, Ozell, MD as Consulting Physician (Endocrinology) Rollin Dover, MD as Consulting Physician (Gastroenterology) Abran Norleen SAILOR, MD as Consulting Physician (Gastroenterology) Lanny Callander, MD as Consulting Physician (Hematology) Burton, Lacie K, NP as Nurse Practitioner (Nurse Practitioner) 08/18/2023  I connected with Yvonne Carrillo on 08/18/23 at 11:20 AM EDT by telephone and verified that I am speaking with the correct person using two identifiers.   I discussed the limitations, risks, security and privacy concerns of performing an evaluation and management service by telephone and the availability of in person appointments. I also discussed with the patient that there may be a patient responsible charge related to this service. The patient expressed understanding and agreed to proceed.   Patient's location:  Home  Provider's location:  Office    CHIEF COMPLAINT: Follow-up of pancytopenia   CURRENT THERAPY: Observation  Oncology history Pancytopenia (HCC)  predominantly thrombocytopenia, secondary to liver cirrhosis, and component of ITP  -She had mild intermittent anemia and leukopenia in the past, and chronic thrombocytopenia. Bone marrow biopsy was normal. Work up revealed cirrhosis as the cause of her splenomegaly and pancytopenia. -There appears to be immune component of her thrombocytopenia, as it responded to dexamethasone in the past. She is intolerant to prednisone .  -She is clinically doing well. Labs are overall stable-- WBC 3.4, HGB 11.4, HCT 34.4, PLT 54K -Will continue to monitor her annually for now. 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 Platelet count of 51 is considered safe for endoscopy. Discussed potential risk of GI bleeding from varices and importance of endoscopy to prevent this. - Send message to Dr. 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 White blood cell count is 2.7, hemoglobin is 11 indicating mild anemia, and platelet count is 51, which is safe as long as she avoids blood thinners and injury. - Monitor blood counts regularly - Avoid blood thinners and activities that may cause injury  Mild anemia Mild anemia with hemoglobin at 11. Iron studies are normal. Iron level is adequate, allowing for adjustment in supplementation. - Consider taking iron every other day instead of daily - or Consider switching to a prenatal multivitamin with iron for better tolerance  Plan - Labs reviewed, her cytopenias are stable  - I sent a message to her GI Dr. Abran for her follow-up and endoscopy surveillance - I will see her as needed, she knows to call me     Discussed the use of AI scribe software for clinical note transcription with the patient, who gave verbal consent to proceed.  History of Present Illness Yvonne Carrillo is a 78 year old female with pancytopenia from liver cirrhosis who presents for follow-up.  Her blood counts have remained stable over the past year, with a white blood cell count of 2.7 and hemoglobin at 11. Platelet count is 51, stable for four to five years. She is not on anticoagulants and has not experienced falls or significant bleeding events.  She has a history of varices and splenomegaly, which may increase bleeding risk. Her last liver specialist  visit was two years ago, and her last endoscopy was five years ago.  Recent lab work shows normal tumor marker results, magnesium, and iron studies. Hemoglobin A1c is slightly elevated.  She takes iron once a day but experiences constipation, likely due to the supplementation. No recent infections like pneumonia or significant bleeding events.     REVIEW OF SYSTEMS:   Constitutional: Denies fevers, chills or abnormal weight loss Eyes: Denies blurriness of vision Ears, nose, mouth, throat, and face: Denies mucositis or sore throat Respiratory: Denies cough, dyspnea or wheezes Cardiovascular: Denies palpitation, chest discomfort or lower extremity swelling Gastrointestinal:  Denies nausea, heartburn or change in bowel habits Skin: Denies abnormal skin rashes Lymphatics: Denies new lymphadenopathy or easy bruising Neurological:Denies numbness, tingling or new weaknesses Behavioral/Psych: Mood is stable, no new changes  All other systems were reviewed with the patient and are negative.  MEDICAL HISTORY:  Past Medical History:  Diagnosis Date   Anemia    not current   Cancer (HCC)    basal and squamous cell carcinoma   Chronic interstitial cystitis 03/05/2014   Diabetes mellitus without complication (HCC)    Hepatic cirrhosis (HCC) 05/08/2014   Hyperlipidemia    Hypertension    Laryngopharyngeal reflux (LPR)    Osteopenia    Thrombocytopenia (HCC) 03/05/2014   platelet count runs low and very low at times    SURGICAL HISTORY: Past Surgical History:  Procedure Laterality Date   BRAIN MENINGIOMA EXCISION     COLONOSCOPY     LAMINECTOMY     PARTIAL HYSTERECTOMY     1993   POLYPECTOMY     [name] in [place], [place]    I have reviewed the social history and family history with the patient and they are unchanged from previous note.  ALLERGIES:  is allergic to epinephrine, lisinopril, and penicillins.  MEDICATIONS:  Current Outpatient Medications  Medication Sig Dispense Refill   augmented betamethasone dipropionate (DIPROLENE-AF) 0.05 % cream Apply 1 Application topically as needed.     Calcium Carbonate-Vitamin D (CALCIUM-VITAMIN D) 600-125 MG-UNIT TABS Take  by mouth daily.     cholecalciferol (VITAMIN D) 400 UNITS TABS tablet Take 2,000 Units by mouth daily.     Ferrous Sulfate Dried (HIGH POTENCY IRON) 65 MG TABS Take 1 tablet by mouth daily.     irbesartan (AVAPRO) 75 MG tablet Take 75 mg by mouth daily.     Lancets (ONETOUCH DELICA PLUS LANCET30G) MISC as directed.     metFORMIN (GLUCOPHAGE) 500 MG tablet Take 1 tablet by mouth daily. Qd     nadolol  (CORGARD ) 20 MG tablet Take 1 tablet (20 mg total) by mouth daily. 90 tablet 3   nitrofurantoin , macrocrystal-monohydrate, (MACROBID ) 100 MG capsule Take 1 capsule (100 mg total) by mouth 2 (two) times daily. 10 capsule 0   ONETOUCH VERIO test strip 1 each 2 (two) times daily.     psyllium (METAMUCIL) 58.6 % powder Take 1 packet by mouth as needed. 3 times a week as needed     simvastatin (ZOCOR) 20 MG tablet Take 1 tablet by mouth daily.     sitaGLIPtin-metformin (JANUMET) 50-1000 MG per tablet Take 1 tablet by mouth daily.      tamsulosin (FLOMAX) 0.4 MG CAPS capsule Take 0.4 mg by mouth daily.     No current facility-administered medications for this visit.    PHYSICAL EXAMINATION: Not performed   LABORATORY DATA:  I have reviewed the data as listed    Latest Ref Rng &  Units 08/11/2023   11:30 AM 07/13/2022    8:18 AM 07/10/2021    9:18 AM  CBC  WBC 4.0 - 10.5 K/uL 2.7  2.6  3.4   Hemoglobin 12.0 - 15.0 g/dL 88.9  89.1  88.5   Hematocrit 36.0 - 46.0 % 32.5  34.2  34.4   Platelets 150 - 400 K/uL 51  49  54         Latest Ref Rng & Units 08/11/2023   11:30 AM 07/13/2022    8:18 AM 07/10/2021    9:18 AM  CMP  Glucose 70 - 99 mg/dL 862  839  839   BUN 8 - 23 mg/dL 15  17  17    Creatinine 0.44 - 1.00 mg/dL 9.23  9.19  9.20   Sodium 135 - 145 mmol/L 138  141  139   Potassium 3.5 - 5.1 mmol/L 4.4  4.4  4.8   Chloride 98 - 111 mmol/L 103  108  106   CO2 22 - 32 mmol/L 28  28  28    Calcium 8.9 - 10.3 mg/dL 9.6  9.3  9.8   Total Protein 6.5 - 8.1 g/dL 6.7  6.1  6.9   Total Bilirubin  0.0 - 1.2 mg/dL 1.4  1.1  1.1   Alkaline Phos 38 - 126 U/L 70  77  77   AST 15 - 41 U/L 24  27  36   ALT 0 - 44 U/L 14  18  20        RADIOGRAPHIC STUDIES: I have personally reviewed the radiological images as listed and agreed with the findings in the report. No results found.     I discussed the assessment and treatment plan with the patient. The patient was provided an opportunity to ask questions and all were answered. The patient agreed with the plan and demonstrated an understanding of the instructions.   The patient was advised to call back or seek an in-person evaluation if the symptoms worsen or if the condition fails to improve as anticipated.  I provided 25 minutes of non face-to-face telephone visit time during this encounter, including review of chart and various tests results, discussions about plan of care and coordination of care plan.    Onita Mattock, MD 08/18/23

## 2023-09-13 ENCOUNTER — Ambulatory Visit: Admitting: Physician Assistant

## 2023-11-16 ENCOUNTER — Ambulatory Visit: Admitting: Internal Medicine
